# Patient Record
Sex: Female | Born: 1935 | Race: Black or African American | Hispanic: No | Smoking: Never smoker
Health system: Southern US, Community
[De-identification: ages and names within clinical notes are randomized; demographics above are authoritative.]

## PROBLEM LIST (undated history)

## (undated) DIAGNOSIS — M1990 Unspecified osteoarthritis, unspecified site: Secondary | ICD-10-CM

## (undated) HISTORY — DX: Unspecified osteoarthritis, unspecified site: M19.90

## (undated) HISTORY — PX: ABDOMINAL HYSTERECTOMY: SHX81

## (undated) HISTORY — PX: JOINT REPLACEMENT: SHX530

---

## 1998-02-11 ENCOUNTER — Emergency Department (HOSPITAL_COMMUNITY): Admission: EM | Admit: 1998-02-11 | Discharge: 1998-02-11 | Payer: Self-pay | Admitting: Emergency Medicine

## 1998-05-14 ENCOUNTER — Emergency Department (HOSPITAL_COMMUNITY): Admission: EM | Admit: 1998-05-14 | Discharge: 1998-05-14 | Payer: Self-pay | Admitting: Emergency Medicine

## 1999-09-28 ENCOUNTER — Encounter: Payer: Self-pay | Admitting: Internal Medicine

## 1999-09-28 ENCOUNTER — Encounter: Admission: RE | Admit: 1999-09-28 | Discharge: 1999-09-28 | Payer: Self-pay | Admitting: Internal Medicine

## 1999-10-31 ENCOUNTER — Other Ambulatory Visit: Admission: RE | Admit: 1999-10-31 | Discharge: 1999-10-31 | Payer: Self-pay | Admitting: Urology

## 1999-12-11 ENCOUNTER — Other Ambulatory Visit: Admission: RE | Admit: 1999-12-11 | Discharge: 1999-12-11 | Payer: Self-pay | Admitting: Urology

## 2000-10-02 ENCOUNTER — Encounter: Payer: Self-pay | Admitting: Internal Medicine

## 2000-10-02 ENCOUNTER — Encounter: Admission: RE | Admit: 2000-10-02 | Discharge: 2000-10-02 | Payer: Self-pay | Admitting: Internal Medicine

## 2000-11-04 ENCOUNTER — Other Ambulatory Visit: Admission: RE | Admit: 2000-11-04 | Discharge: 2000-11-04 | Payer: Self-pay | Admitting: Urology

## 2001-10-05 ENCOUNTER — Encounter: Payer: Self-pay | Admitting: Internal Medicine

## 2001-10-05 ENCOUNTER — Encounter: Admission: RE | Admit: 2001-10-05 | Discharge: 2001-10-05 | Payer: Self-pay | Admitting: Internal Medicine

## 2002-04-08 ENCOUNTER — Encounter: Payer: Self-pay | Admitting: Orthopedic Surgery

## 2002-04-14 ENCOUNTER — Inpatient Hospital Stay (HOSPITAL_COMMUNITY): Admission: RE | Admit: 2002-04-14 | Discharge: 2002-04-16 | Payer: Self-pay | Admitting: Orthopedic Surgery

## 2002-04-16 ENCOUNTER — Inpatient Hospital Stay (HOSPITAL_COMMUNITY)
Admission: RE | Admit: 2002-04-16 | Discharge: 2002-04-21 | Payer: Self-pay | Admitting: Physical Medicine & Rehabilitation

## 2002-05-04 ENCOUNTER — Encounter: Admission: RE | Admit: 2002-05-04 | Discharge: 2002-05-27 | Payer: Self-pay | Admitting: Orthopedic Surgery

## 2002-10-06 ENCOUNTER — Encounter: Payer: Self-pay | Admitting: Internal Medicine

## 2002-10-06 ENCOUNTER — Encounter: Admission: RE | Admit: 2002-10-06 | Discharge: 2002-10-06 | Payer: Self-pay | Admitting: Internal Medicine

## 2003-04-01 ENCOUNTER — Encounter (INDEPENDENT_AMBULATORY_CARE_PROVIDER_SITE_OTHER): Payer: Self-pay | Admitting: *Deleted

## 2003-04-01 ENCOUNTER — Ambulatory Visit (HOSPITAL_COMMUNITY): Admission: RE | Admit: 2003-04-01 | Discharge: 2003-04-01 | Payer: Self-pay | Admitting: *Deleted

## 2003-10-10 ENCOUNTER — Encounter: Admission: RE | Admit: 2003-10-10 | Discharge: 2003-10-10 | Payer: Self-pay | Admitting: Internal Medicine

## 2004-07-06 ENCOUNTER — Inpatient Hospital Stay (HOSPITAL_COMMUNITY): Admission: RE | Admit: 2004-07-06 | Discharge: 2004-07-09 | Payer: Self-pay | Admitting: Orthopedic Surgery

## 2004-10-22 ENCOUNTER — Encounter: Admission: RE | Admit: 2004-10-22 | Discharge: 2004-10-22 | Payer: Self-pay | Admitting: Internal Medicine

## 2005-10-25 ENCOUNTER — Encounter: Admission: RE | Admit: 2005-10-25 | Discharge: 2005-10-25 | Payer: Self-pay | Admitting: Internal Medicine

## 2006-01-24 ENCOUNTER — Encounter: Admission: RE | Admit: 2006-01-24 | Discharge: 2006-01-24 | Payer: Self-pay | Admitting: Internal Medicine

## 2006-10-27 ENCOUNTER — Encounter: Admission: RE | Admit: 2006-10-27 | Discharge: 2006-10-27 | Payer: Self-pay | Admitting: Internal Medicine

## 2007-10-30 ENCOUNTER — Encounter: Admission: RE | Admit: 2007-10-30 | Discharge: 2007-10-30 | Payer: Self-pay | Admitting: Internal Medicine

## 2008-01-26 ENCOUNTER — Encounter: Admission: RE | Admit: 2008-01-26 | Discharge: 2008-01-26 | Payer: Self-pay | Admitting: Internal Medicine

## 2008-10-31 ENCOUNTER — Encounter: Admission: RE | Admit: 2008-10-31 | Discharge: 2008-10-31 | Payer: Self-pay | Admitting: Internal Medicine

## 2009-11-03 ENCOUNTER — Encounter: Admission: RE | Admit: 2009-11-03 | Discharge: 2009-11-03 | Payer: Self-pay | Admitting: Internal Medicine

## 2010-11-05 ENCOUNTER — Encounter
Admission: RE | Admit: 2010-11-05 | Discharge: 2010-11-05 | Payer: Self-pay | Source: Home / Self Care | Attending: Internal Medicine | Admitting: Internal Medicine

## 2011-02-22 NOTE — H&P (Signed)
NAME:  Ashlee Duarte, Ashlee Duarte NO.:  [id number]   MEDICAL RECORD NO.:  [id number]          PATIENT TYPE:  INP   LOCATION:  NA                           FACILITY:  [facility]   PHYSICIAN:  Dyke Brackett, M.D.    DATE OF BIRTH:  1928/04/06   DATE OF ADMISSION:  07/06/2004  DATE OF DISCHARGE:                                HISTORY & PHYSICAL   CHIEF COMPLAINT:  Left knee weakness and locking for the last four to tive  years.   HISTORY OF PRESENT ILLNESS:  This 77 year old black female patient presented  to Dr. Madelon Lips with a four to five-year history of gradual onset of  progressively worsening intermittent knee pain. She has a history of right  knee replacement by Dr. Madelon Lips July 2003, and she did well following that  surgery. She has had no known injury or prior surgery to her left knee.   The biggest problem she reports with her knee is that it locks at times, and  the leg is weak.  She has intermittent soreness on the anterior aspect of  the knee without radiation.  Difficulty with her knee increases with walking  or after exercises and decreases with Advil and use of swimming pool.  The  knee does grind, pop, and lock at times.  It also swells.  There is no  catching or giving way.  The pain does not keep her up at night.  She does  not require any assistive devices. She has received cortisone injections in  the past with some relief.   ALLERGIES:  SULFA.   CURRENT MEDICATIONS:  1.  Hydrochlorothiazide 25 mg 1/2 tablet p.o. q.a.m.  2.  Lipitor 10 mg 1 tablet p.o. q.a.m.  3.  Multivitamins 1 tablet p.o. q.a.m.  4.  Detrol LA 4 mg 1 tablet p.o. q.a.m. p.r.n.  5.  Vitamin B 1 p.o. q.a.m..  6.  Vitamin C 1 p.o. q.a.m.  7.  Vitamin D 1 p.o. q.a.m.  8.  Fish oil 1 p.o. q.a.m.  9.  Calcium 600 mg 1 tablet p.o. q.a.m.   PAST MEDICAL HISTORY:  1.  Hypertension.  2.  Hypercholesterolemia.  3.  Mild peripheral edema.   She denies any history of diabetes mellitus, thyroid  disease, hiatal hernia,  peptic ulcer disease, reflux heart disease, or any other chronic medical  condition other than previously noted.   PAST SURGICAL HISTORY:  1.  Tonsillectomy as a child.  2.  Hysterectomy in 1968.  3.  Right total knee arthroplasty by Dr. Marcie Mowers, M.D. April 14, 2002.   She denies any complications from the above-mentioned procedures.   FAMILY HISTORY:  Mother died at about age 78 with respiratory complications  and possibly pneumonia.  Father died in his 33s of unknown causes.  She has  five brothers and three sisters who have all died due to multiple cardiac  issues.  She has one sister who is currently alive, 102 years old, with  history of anxiety disorder.   PRIMARY CARE PHYSICIAN:  Antony Madura, M.D.  REVIEW OF SYSTEMS:  She does have upper and lower partial plate dentures.  She has a small area on the left upper jaw line that has been irritated for  the last couple of days, and she has been treating it with warm salt water  rinses.  She will see a dentist about that.  She has some mild neck  stiffness.  She will donate 1 unit of autologous blood.  She does have a  living will but no power of attorney.  All other systems are negative and  noncontributory.   PHYSICAL EXAMINATION:  GENERAL:  Well-developed, well-nourished, overweight  black female in no acute distress.  Walks with a limp.  Mood and affect are  appropriate.  Talks easily with examiner.  VITAL SIGNS: Height 5 feet 7 inches, weight 204 pounds.  BMI is 31.  Temperature 96.3 degrees F, pulse 72, respirations 20, blood pressure  144/74.  HEENT:  Normocephalic and atraumatic without frontal or maxillary sinus  tenderness to palpation.  Conjunctivae pink, sclerae anicteric.  PERRLA.  EOMs intact.  No visible external ear deformities.  Moderate amount of  cerumen in both canals.  Tympanic membranes pearly grey with good light  reflex.  Nose and nasal septum midline.  Nasal  mucosa pink and moist without  exudate or polyps noted.  Buccal mucosa pink and moist.  Partial plate  dentures are in place.  She does have an area of redness on upper jaw line  in the front but on the left side.  It is minimal, no active drainage from  that area.  Pharynx without erythema or exudate.  Tongue and uvula midline.  Tongue without fasciculations, and uvula rises equally with phonation.  NECK:  No visible masses or lesions noted.  Trachea midline.  No palpable  lymphadenopathy nor thyromegaly.  Carotids +1 bilaterally without bruits.  Full range of motion and nontender to palpation along the cervical spine.  CARDIOVASCULAR:  Heart rate and rhythm regular.  S1, S2 present without  rubs, clicks, or murmurs noted.  RESPIRATORY:  Respirations even and unlabored.  Breath sounds clear to  auscultation bilaterally without rales or wheezes noted.  ABDOMEN:  Rounded abdominal contour. Bowel sounds presents x 4 quadrants.  Soft, nontender to palpation without hepatosplenomegaly or CVA tenderness.  EXTREMITIES:  Femoral pulses +2 bilaterally.  BACK:  Nontender to palpation along the entire length of the vertebral  column.  BREASTS/GU/RECTAL/PELVIC:  These exams deferred at this time.  MUSCULOSKELETAL:  No obvious deformities bilateral upper extremities with  full range of motion of these extremities without pain.  Radial pulses +2.  He has full range of motion of his hips, ankles, and toes bilaterally.  DP  and PT pulses +2.  Mild lower extremity edema that is nonpitting  bilaterally.  No calf pain with palpation and negative Homan's sign  bilaterally.  RIGHT KNEE:  Has a well-healed midline incision.  Skin otherwise intact.  She has full  extension and flexion to 110 degrees without crepitance.  There is pain with palpation long the joint line and no effusion.  She is  stable to varus and valgus stress.  Negative anterior drawer. LEFT KNEE:  Skin is intact without erythema or  ecchymosis.  She has full  extension and flexion to 100 degrees with a moderate amount of crepitance.  She is acutely tender to palpation along the medial joint line and some on  the lateral joint line.  She does have a +1 effusion.  She  is stable to  varus and valgus stress.  Negative anterior drawer.  NEUROLOGIC:  Alert and oriented x 3.  Cranial nerves II-XII grossly intact.  Strength 5/5 bilateral upper and lower extremities.  Rapid alternating  movements intact.  Deep tendon reflexes 2+ bilateral upper and lower  extremities.  Sensation intact to light touch.   IMPRESSION:  1.  End-stage osteoarthritis bilateral knees, status post right total knee      arthroplasty.  2.  Hypertension.  3.  Obesity.  4.  Hypercholesterolemia.  5.  Mild peripheral edema.   PLAN:  Ms. Rekowski will be admitted to [facility] on July 06, 2004 , where the patient will undergo a left total knee arthroplasty by Dr.  Marcie Mowers.  She will undergo all the routine preoperative laboratory  tests and studies prior to this procedure.  If we have any medical issues  while hospitalized, we will consult on of the hospitalists.       KED/MEDQ  D:  06/28/2004  T:  06/28/2004  Job:  [id number]

## 2011-02-22 NOTE — H&P (Signed)
. [facility]  Patient:    Ashlee Duarte, Ashlee Duarte Visit Number: [id number] MRN: [id number]          Service Type: Attending:  Sharlot Gowda., M.D. Dictated by:   Jamelle Rushing, P.A. Adm. Date:  04/14/02                           History and Physical  DATE OF BIRTH: 02/10/1928  CHIEF COMPLAINT: Right knee pain.  HISTORY OF PRESENT ILLNESS: The patient is a 77 year old black female, with knee problems for about the last eight years.  The patients problems have progressively worsened with time and the amount of activity.  She does have significant difficulty getting in and out of a chair, her bed, a car.  She does have stiffness and locking of the knee with any long-term standing or sitting.  She describes the pain as primarily being a dull aching sensation around the joint line and very proximal tibia region.  She does have a bone-on-bone grating sensation with occasional popping.  She does have significant swelling, and she does have pain at night.  ALLERGIES: SULFA.  CURRENT MEDICATIONS:  1. Bextra 20 mg p.o. q.d.  2. Hydrochlorothiazide 12.5 mg p.o. q.d.  3. Tylenol p.r.n.  4. Multivitamin.  PAST MEDICAL HISTORY:  1. Hypertension.  2. Peripheral edema.  PAST SURGICAL HISTORY: Hysterectomy in 1968, with no complications.  SOCIAL HISTORY: The patient is a 77 year old obese black female.  Denies any history of smoking or alcohol use.  She is a widow.  She does have one grown child.  She lives in a two-story home.  She is currently retired.  FAMILY PHYSICIAN: Dr. Burton Apley.  FAMILY MEDICAL HISTORY: Mother is deceased from respiratory complications, possibly pneumonia.  Father is deceased from unknown causes.  The patient has five brothers and three sisters deceased from multiple cardiac issues.  One sister alive, with history of anxiety and nervous disorder.  REVIEW OF SYSTEMS: Positive for upper and lower partial plates.   She does occasionally get short of breath with exertion related to obesity, deconditioning, and knee pain.  Otherwise the Review Of Systems is negative.  PHYSICAL EXAMINATION:  VITAL SIGNS: Height is 5 feet 7 inches.  Weight is 205 pounds.  Pulse 88, respirations 12, temperature 99.0 degrees, blood pressure 164/90.  GENERAL: This is a healthy-appearing, well-developed, obese black female.  She ambulates very slowly with a significant swaying gait.  She has a fair amount of difficulty getting on and off the examination table or in and out of a chair without assistance.  HEENT: Head normocephalic, atraumatic.  Nontender over maxillary or frontal sinuses.  PERRLA.  EOMI.  TMs pearly gray and intact.  Gross hearing is intact.  Nasal septum midline.  Mucous membranes pink and moist.  No polyps. Oral buccal mucosa pink and moist without lesions.  Upper and lower partial plates in place, rest of dentition in fair repair.  The patient is able to swallow without any difficulty.  LUNGS: Clear and equal bilaterally.  No wheezes, rales, rhonchi, or rubs noted.  HEART: Regular rate and rhythm.  S1 and S2 auscultated.  No murmurs, rubs, or gallops.  ABDOMEN: Round, obese.  No hepatosplenomegaly palpable due to obesity.  Soft, nontender to deep palpation.  Bowel sounds were normoactive throughout.  CVA nontender to percussion.  EXTREMITIES: Symmetric size and shape.  The patient had a significant amount of proximal extremity  obesity.  She had good range of motion of her shoulders, elbows, and wrists without any difficulty.  Motor strength was 5/5.  Lower extremities symmetric size and shape.  Motor surgical was 5/5 in all muscle groups tested.  She once again had a significant amount of proximal extremity obesity.  Right and left hip had good range of motion without any deficits or mechanical symptoms.  Right knee had no signs of erythema or ecchymosis.  There was a fair amount of soft tissue  swelling around the joint. No palpable effusion.  She was tender along the lateral joint line.  She had about a 12 degree valgus deformity.  She had about 10 degrees of laxity with valgus-varus stressing.  No anterior or posterior drawer.  The calf was nontender.  Range of motion was from 0-95 degrees, limited by soft tissue in her thigh.  The left knee was without any signs of erythema or ecchymosis. She had no palpable effusion.  She was slightly tender along the medial and lateral joint line.  She had about a 5 degree valgus-varus laxity.  She had full extension and 95 degrees of flexion, limited by soft tissue in her thigh. Bilateral calves were nontender.  Bilateral ankles were symmetric with good dorsiflexion and plantar flexion.  Peripheral vasculature - carotid pulses were 2+, radial pulses were 2+, dorsalis pedis and posterior tibial pulses were 2+.  The patient had 1+ pitting edema.  No significant varicosities or lower extremity pigmentation changes.  NEUROLOGIC: The patient was conscious, alert, appropriate, and held an easy conversation with the examiner.  Cranial nerves II-XII were grossly intact. Deep tendon reflexes of the upper and lower extremities were symmetric right-to-left.  BREAST/RECTAL/GU: Examinations were deferred at this time.  IMPRESSION:  1. Hypertension.  2. Lower extremity peripheral edema.  3. Obesity.  4. End-stage osteoarthritis, right greater than left.  PLAN: The patient will be admitted to [facility] on April 14, 2002 under the care of Dr. Frederico Hamman, M.D.  The patient will undergo all routine laboratories and tests prior to undergoing a right total knee arthroplasty.  The patient does have 2 units of autologous blood donated. Dictated by:   Jamelle Rushing, P.A. Attending:  Sharlot Gowda., M.D. DD:  04/01/02 TD:  04/03/02 Job: [id number]

## 2011-02-22 NOTE — Op Note (Signed)
NAME:  Ashlee Duarte, Ashlee Duarte NO.:  [id number]   MEDICAL RECORD NO.:  [id number]          PATIENT TYPE:  INP   LOCATION:  5035                         FACILITY:  [facility]   PHYSICIAN:  Thera Flake., M.D.DATE OF BIRTH:  09/01/1928   DATE OF PROCEDURE:  07/06/2004  DATE OF DISCHARGE:                                 OPERATIVE REPORT   PREOPERATIVE DIAGNOSIS:  Osteoarthritis, left knee, with severe varus  deformity.   POSTOPERATIVE DIAGNOSIS:  Osteoarthritis, left knee, with severe varus  deformity.   OPERATION:  Left total knee replacement (LCS standard femur, standard  patella, 2.5-size tibial tray with 12.5-mm bearing with 3-prong patella).   SURGEON:  Dyke Brackett, M.D.   ASSISTANT:  Madilyn Fireman, P.A.-C.   TOURNIQUET TIME:  One hour and 30 minutes.   DESCRIPTION OF PROCEDURE:  Sterile prep and drape, supine position,  exsanguination of the leg and placement of tourniquet to 375.  Straight skin  incision, medial parapatellar approach to the knee, was made.  Severe varus  deformity of the knee was noted with bony overgrowth along the medial side.  Tibia was cut with a 7-degree posterior slope, followed by an anterior and  posterior femoral cut for a 12.5-mm flexion gap, later equalized with the  extension gap to be the same.  Distal 4-degree cut was then made with a  provisional second cut but to allow for extension with excellent balance of  the ligaments, both with a spacer block with flexion and extension.  Excess  portions of the menisci were removed.  The finishing guide was used with a  chamfer cut as well as the keel hole for the femoral prosthesis, followed by  delivery of the tibia anteriorly, release of the PCL.  A keel hole was cut  for the tibial prosthesis, followed by a 2.5-size tibial tray and trial  reduction.  A 3-peg patella was used, leaving 14 mm of native patella.  Best  component thickness was trialed at a 10, then 12.5 and deemed to be more  acceptable at 12.5 with excellent stability, full range of motion.  Trial  components were removed and final components were inserted after bony  surfaces were irrigated with 2 batches of cement in the doughy state.  Excess cement was removed.  A trial bearing was again placed; it was  removed, tourniquet was released, small bleeders were coagulated, excess  cement was removed from the back of the knee.  Good hemostasis was obtained.  Closure was effected with #1 Ethibond, 2-0 Vicryl and skin clips, Marcaine  with epinephrine on the skin, applied the compressive sterile dressing, knee  immobilizer applied.      [name]   WDC/MEDQ  D:  07/06/2004  T:  07/07/2004  Job:  [id number]

## 2011-02-22 NOTE — Discharge Summary (Signed)
Ashlee Duarte. [facility]  Patient:    Ashlee, Duarte Visit Number: [id number] MRN: [id number]          Service Type: [facility] Location: [id number] Attending Physician:  Faith Rogue T Dictated by:   Mcarthur Rossetti. Angiulli, P.A. Admit Date:  04/16/2002 Discharge Date: 04/21/2002   CC:         Faith Rogue, M.D.  Sharlot Gowda., M.D.  Antony Madura, M.D.   Discharge Summary  DISCHARGE DIAGNOSES: 1. Right total knee replacement secondary to osteoarthritis, bone on bone,    April 14, 2002. 2. Anemia. 3. Hypertension. 4. Peripheral edema.  HISTORY OF PRESENT ILLNESS:  This is a 77 year old black female admitted July 9 with end-stage bone on bone osteoarthritis of her right knee and no relief with conservative care.  She underwent a right total knee replacement on July 9 per Dr. Madelon Lips.  She was placed on subcutaneous Lovenox for deep vein thrombosis prophylaxis and weight-bearing as tolerated.  Hospital course was uneventful.  No chest pain, no nausea or vomiting.  Supervision for ambulation, moderate assistance for transfers.  Latest hemoglobin was 8.4, chemistries unremarkable.  Admitted for a comprehensive rehab program.  PAST MEDICAL HISTORY:  See discharge diagnoses.  PAST SURGICAL HISTORY: 1. Hysterectomy. 2. Right carpal tunnel release.  HABITS:  No alcohol or tobacco.  ALLERGIES:  SULFA.  PRIMARY PHYSICIAN:  Antony Madura, M.D.  MEDICATIONS PRIOR TO ADMISSION: 1. Bextra. 2. Hydrochlorothiazide. 3. Multivitamin.  SOCIAL HISTORY:  Lives alone in [place].  Independent prior to admission, retired.  One-level apartment.  Good supportive friends.  HOSPITAL COURSE:  The patient did well while in rehabilitation services with therapies initiated on a b.i.d. basis.  The following issues were followed during the patients rehab course.  Pertaining to Ms. Robinsons right total knee replacement, she remained stable.  Surgical  site healing nicely, no signs of infection.  She was ambulating with supervision with a walker, weight-bearing as tolerated with total knee precautions.  She was continued on subcutaneous Lovenox for deep vein thrombosis prophylaxis.  A home health nurse would be provided as Lovenox would continue per protocol.  She was also fully educated on self-administration of injections.  Postoperative anemia was stable.  Latest hemoglobin was 9.3 and hematocrit 28.1.  There were no bleeding episodes.  Blood pressure was controlled with hydrochlorothiazide. She had no bowel or bladder disturbances.  She was ambulating extended distances with a walker of greater than 150 feet, independent with activities of daily living.  The plan was for home health therapies to be arranged.  LATEST LABORATORY:  Sodium 144, potassium 3.6, BUN 10, creatinine 0.6, hemoglobin 9.3, hematocrit 28.1  DISCHARGE MEDICATIONS: 1. Subcutaneous Lovenox 40 mg daily x 3-5 more days and discontinue. 2. Trinsicon b.i.d. 3. Hydrochlorothiazide 12.5 mg daily. 4. Multivitamin daily. 5. Bextra 20 mg daily.  DISCHARGE INSTRUCTIONS: 1. Activity: Weight-bearing as tolerated with walker. 2. Diet: Regular. 3. Wound care: Follow up with Dr. Madelon Lips in one week.  Home health therapies    had been arranged, as well as home health nursing for monitoring of    Lovenox injections.  She would follow up with Dr. Zenaida Deed for medical    management. Dictated by:   Mcarthur Rossetti. Angiulli, P.A. Attending Physician:  Faith Rogue T DD:  04/20/02 TD:  04/22/02 Job: [id number]

## 2011-02-22 NOTE — Discharge Summary (Signed)
NAME:  Ashlee Duarte, Ashlee Duarte NO.:  [id number]   MEDICAL RECORD NO.:  [id number]          PATIENT TYPE:  INP   LOCATION:  5035                         FACILITY:  [facility]   PHYSICIAN:  Dyke Brackett, M.D.    DATE OF BIRTH:  16-Oct-1927   DATE OF ADMISSION:  07/06/2004  DATE OF DISCHARGE:  07/09/2004                                 DISCHARGE SUMMARY   ADMISSION DIAGNOSES:  1.  End-stage osteoarthritis bilateral knees, status post right knee      replacement.  2.  Hypertension.  3.  Obesity.  4.  Mild lower extremity peripheral edema.   DISCHARGE DIAGNOSES:  1.  End-stage osteoarthritis bilateral knees, status post right knee      replacement, now status post left knee replacement.  2.  Acute blood loss anemia secondary to surgery.  3.  Small leukocytosis.  4.  Constipation.  5.  Hypertension.  6.  Hypercholesterolemia.  7.  Obesity.  8.  Lower extremity peripheral edema.   PROCEDURE:  On July 06, 2004, Ashlee Duarte underwent a left total knee  arthroplasty by Dr. Dyke Brackett, assisted by Richardean Canal, P.A.C.  Ashlee  had an LCS complete metal back patella cemented size standard with a DePuy  MVT keel tibial tray cemented size 2.5 with an LCS complete primary femoral  component cemented size standard left and an LCS complete RP insert size  standard 12.5 mm thickness.   COMPLICATIONS:  None.   CONSULTATIONS:  1.  Pharmacy consult for Coumadin therapy July 06, 2004.  2.  Physical therapy and occupational therapy consults July 07, 2004.   HISTORY OF PRESENT ILLNESS:  This 77 year old black female patient presented  to Dr. Madelon Lips with a history of bilateral knee pain.  Ashlee had had problems  with her right knee and subsequently had that one replaced.  Ashlee did well  with that but continued to have left knee pain that has been getting worse.  X-rays show end-stage osteoarthritis of the knee and Ashlee has failed  conservative treatment and because of that,  Ashlee is presenting for a left  knee replacement.   HOSPITAL COURSE:  Ashlee Duarte tolerated her surgical procedure well without  immediately postoperative complications.  Ashlee was subsequently transferred  to 5000.  On postoperative day #1, Ashlee was afebrile, vital signs stable.  Few rhonchi noted in her lungs which was addressed with pulmonary toilet.  Ashlee had some mild hypokalemia which was treated with supplements.  Hemoglobin was monitored at 9.9 with hematocrit of 28.6.  Ashlee was started on  therapy per protocol.   Ashlee continued to make good progress over the next several days.  Ashlee  remained basically afebrile.  Ashlee was able to be switched to p.o.  medications effectively to control her pain.  Ashlee progressed well with  therapy.  Potassium improved to 3.5 on July 09, 2004.  On July 09, 2004, Ashlee is felt to be ready for discharge home and will be discharged home  later today.   DISCHARGE INSTRUCTIONS:  1.  Diet:  Ashlee can resume her regular prehospitalization diet.  2.  Medications:  Ashlee may resume her prehospitalization medications except      for no Advil or Aleve while on Coumadin.  Home medications include:      1.  Lipitor 10 mg p.o. q.h.s.      2.  Hydrochlorothiazide 12.5 mg p.o. q.a.m.      3.  Detrol LA 4 mg p.o. daily p.r.n.      4.  Multivitamin one tablet p.o. daily.      5.  Vitamin B one tablet p.o. daily.      6.  Vitamin C one tablet p.o.  daily.      7.  Fish oil capsule one tablet p.o. daily.  3.  Additional medications:      1.  Coumadin, take as directed by the pharmacy at 6 p.m. for one month.      2.  Percocet 5/325 mg one to two p.o. q.4h. p.r.n. for pain, 50 with no          refill.      3.  Robaxin 500 mg one to two tablets p.o. q.6h. p.r.n. for spasms, 40          with no refill.      4.  Trinsicon one tablet p.o. b.i.d. for one month.  4.  Activity:  Ashlee can be out of bed weightbearing as tolerated on the left      leg with use of the walker. Ashlee  is to have home CPM 0 to 100 degrees six      to eight hours a day.  Home health PT per [facility].      Please see the blue total knee discharge sheet for further activity      instructions.  5.  Wound care:  Ashlee may shower after no drainage from the wound for two      days.  Please see the blue total knee discharge sheet for further wound      care instructions.  6.  Follow-up:  Home health RN is to draw a CBC with differential on July 11, 2004, and fax those results to our office.  The patient needs to      follow up with Dr. Madelon Lips on Thursday, July 19, 2004, and call [phone] for that appointment.   LABORATORY DATA:  Chest x-ray done on July 02, 2004, showed no active  cardiopulmonary disease.   On July 02, 2004, hemoglobin 11.7, hematocrit 34.4.  On July 07, 2004, hemoglobin 9.9, hematocrit 28.6, white count 11.2.  On July 08, 2004, white count 13.7, hemoglobin 9.5, hematocrit 27.6.  On July 09, 2004, white count 12.8, hemoglobin 9.4, hematocrit 27.1, platelets 282, MCV  89.   On July 02, 2004, potassium 4.1.  On July 07, 2004, potassium was  2.9.  On July 08, 2004, it was 3.3 and on July 09, 2004, it was 3.5.  Glucose ranged from 115 on July 02, 2004, to 125 on July 07, 2004.   PT on admission on July 02, 2004, was 13.4, INR 1, PTT 28.  On July 09, 2004, PT is 13.9, INR 1.1.   Calcium on July 09, 2004, is 8.3.  UA on July 06, 2004, showed rare  bacteria, small leukocyte esterase,  0 to 2 white cells, 3 to 6 red cells,  moderate hemoglobin.  All other indices within normal limits.  Urine culture  is pending at this time.      Legrand Pitts   KED/MEDQ  D:  07/09/2004  T:  07/09/2004  Job:  [id number]   cc:   Antony Madura, M.D.  [address]  [place]  [place] [address]  Fax: [phone]

## 2011-02-22 NOTE — Op Note (Signed)
[place]. [facility]  Patient:    Ashlee Duarte, Ashlee Duarte Visit Number: [id number] MRN: [id number]          Service Type: [facility] Location: [id number] Attending Physician:  Faith Rogue T Dictated by:   Sharlot Gowda., M.D. Proc. Date: 04/14/02 Admit Date:  04/16/2002                             Operative Report  PREOPERATIVE DIAGNOSIS:  Osteoarthritis, right knee.  POSTOPERATIVE DIAGNOSIS:  Osteoarthritis, right knee.  PROCEDURE:  Right total knee replacement (LCS rotating platform, standard femur and patella, 2.5 size tibia with 12.5 mm bearing).  SURGEON:  Sharlot Gowda., M.D.  ASSISTANT:  Jamelle Rushing, P.A.  TOURNIQUET TIME:  Approximately 1 hour 10 minutes.  DESCRIPTION OF PROCEDURE:  Sterile prep and drape.  Exsanguination of the leg. Placement of the tourniquet to 400 mmHg.  Straight skin incision, medial parapatellar approach to the knee was made.  There was relatively severe compartmental disease, greater on the medial side.  The tibia was cut with a 7 degree posterior slope about 2 degrees below the most diseased compartment, followed by cutting the anterior-posterior femoral area in a 4 degree valgus cut on the distal femur.  The flexion-extension gap was equalized to be at 12.5 mm.  Chamfer cuts were made, followed by a keel hole for the tibia.  The patella was cut, leaving about 13 mm of native bone on the patella, replacing about 10 mm of thickness on the patella for a three-pronged rotating platform patella.  Trials were inserted, excellent stability was noted with full extension to slight recurvatum, excellent stability of the bearing, and flexion up to about 110 degrees without any instability noted.  Trials were removed, bony surfaces were irrigated.  Bone cement impregnated with Zinacef was inserted, two packs of cement each with 750 mg of Zinacef in the antibiotic, tibia, followed by femur, patella.  Excess cement  was removed. The cement was allowed to harden.  The tourniquet was released, and small bleeders were coagulated and a Hemovac drain was placed.  The capsular incision was closed with #1 nonabsorbable sutures, 2-0 Vicryl, skin clips. Marcaine with epinephrine infiltrated in the wound, a lightly compressive sterile dressing and knee immobilizer applied. Dictated by:   Sharlot Gowda., M.D. Attending Physician:  Faith Rogue T DD:  04/14/02 TD:  04/17/02 Job: [id number]

## 2011-10-14 ENCOUNTER — Other Ambulatory Visit: Payer: Self-pay | Admitting: Internal Medicine

## 2011-10-14 DIAGNOSIS — Z1231 Encounter for screening mammogram for malignant neoplasm of breast: Secondary | ICD-10-CM

## 2011-11-08 ENCOUNTER — Ambulatory Visit
Admission: RE | Admit: 2011-11-08 | Discharge: 2011-11-08 | Disposition: A | Discharge status: Home / Self Care | Payer: Federal, State, Local not specified - PPO | Source: Ambulatory Visit | Attending: Internal Medicine | Admitting: Internal Medicine

## 2011-11-08 DIAGNOSIS — Z1231 Encounter for screening mammogram for malignant neoplasm of breast: Secondary | ICD-10-CM

## 2012-10-19 ENCOUNTER — Other Ambulatory Visit: Payer: Self-pay | Admitting: Internal Medicine

## 2012-10-19 DIAGNOSIS — Z1231 Encounter for screening mammogram for malignant neoplasm of breast: Secondary | ICD-10-CM

## 2012-11-18 ENCOUNTER — Ambulatory Visit: Payer: Federal, State, Local not specified - PPO

## 2012-12-14 ENCOUNTER — Ambulatory Visit
Admission: RE | Admit: 2012-12-14 | Discharge: 2012-12-14 | Disposition: A | Discharge status: Home / Self Care | Payer: Federal, State, Local not specified - PPO | Source: Ambulatory Visit | Attending: Internal Medicine | Admitting: Internal Medicine

## 2012-12-14 DIAGNOSIS — Z1231 Encounter for screening mammogram for malignant neoplasm of breast: Secondary | ICD-10-CM

## 2013-11-16 ENCOUNTER — Other Ambulatory Visit: Payer: Self-pay

## 2013-11-16 DIAGNOSIS — Z1231 Encounter for screening mammogram for malignant neoplasm of breast: Secondary | ICD-10-CM

## 2013-12-15 ENCOUNTER — Ambulatory Visit
Admission: RE | Admit: 2013-12-15 | Discharge: 2013-12-15 | Disposition: A | Discharge status: Home / Self Care | Payer: Medicare PPO | Source: Ambulatory Visit

## 2013-12-15 DIAGNOSIS — Z1231 Encounter for screening mammogram for malignant neoplasm of breast: Secondary | ICD-10-CM

## 2014-02-07 ENCOUNTER — Other Ambulatory Visit: Payer: Self-pay | Admitting: Internal Medicine

## 2014-02-07 DIAGNOSIS — M810 Age-related osteoporosis without current pathological fracture: Secondary | ICD-10-CM

## 2014-02-07 DIAGNOSIS — Z780 Asymptomatic menopausal state: Secondary | ICD-10-CM

## 2014-02-15 ENCOUNTER — Ambulatory Visit
Admission: RE | Admit: 2014-02-15 | Discharge: 2014-02-15 | Disposition: A | Discharge status: Home / Self Care | Payer: Medicare PPO | Source: Ambulatory Visit | Attending: Internal Medicine | Admitting: Internal Medicine

## 2014-02-15 DIAGNOSIS — Z780 Asymptomatic menopausal state: Secondary | ICD-10-CM

## 2014-02-15 DIAGNOSIS — M810 Age-related osteoporosis without current pathological fracture: Secondary | ICD-10-CM

## 2014-05-12 ENCOUNTER — Ambulatory Visit (INDEPENDENT_AMBULATORY_CARE_PROVIDER_SITE_OTHER): Payer: Medicare PPO | Admitting: Family Medicine

## 2014-05-12 VITALS — BP 128/76 | HR 100 | Temp 98.0°F | Resp 18 | Ht 64.74 in | Wt 220.6 lb

## 2014-05-12 DIAGNOSIS — S61409A Unspecified open wound of unspecified hand, initial encounter: Secondary | ICD-10-CM

## 2014-05-12 DIAGNOSIS — S61401A Unspecified open wound of right hand, initial encounter: Secondary | ICD-10-CM

## 2014-05-12 NOTE — Progress Notes (Addendum)
Subjective:    Patient ID: Ashlee Duarte, female    DOB: 01-21-1928, 80 y.o.   MRN: [id number] This chart was scribed for Ashlee Staggers, MD by Evon Slack, ED Scribe. This Patient was seen in room 07 and the patients care was started at 9:11 PM  Authored by Silas Sacramento, MD - unable to change in [facility].   Chief Complaint  Patient presents with   Laceration    Right pinky finger, cut on alluminum foil box earlier today   Hand Pain    Laceration   Hand Pain    HPI Comments: Ashlee Duarte is a 80 y.o. female who presents to the [facility] complaining of right hand laceration to her 5th digit onset 3PM today. She states she was tearing some aluminium foil and cut her finger. She states she has applied pressure and a guaze that helped control the bleeding. She states she was concerned because she takes blood thinners. She doesn't report any associated numbness. Pt is unsure of her last tetanus shot but will follow up with her PCP.    There are no active problems to display for this patient.  Past Medical History  Diagnosis Date   Arthritis    Past Surgical History  Procedure Laterality Date   Joint replacement     Abdominal hysterectomy     Allergies  Allergen Reactions   Naproxen Sodium Swelling   Prior to Admission medications   Medication Sig Start Date End Date Taking? Authorizing Provider  alendronate (FOSAMAX) 10 MG tablet Take 10 mg by mouth daily before breakfast. Take with a full glass of water on an empty stomach.   Yes Historical Provider, MD  aspirin 81 MG tablet Take 81 mg by mouth daily.   Yes Historical Provider, MD  atorvastatin (LIPITOR) 10 MG tablet Take 10 mg by mouth daily.   Yes Historical Provider, MD  hydrochlorothiazide (HYDRODIURIL) 25 MG tablet Take 25 mg by mouth daily.   Yes Historical Provider, MD  losartan (COZAAR) 25 MG tablet Take 25 mg by mouth daily.   Yes Historical Provider, MD   History   Social  History   Marital Status: Widowed    Spouse Name: N/A    Number of Children: N/A   Years of Education: N/A   Occupational History   Not on file.   Social History Main Topics   Smoking status: Never Smoker    Smokeless tobacco: Not on file   Alcohol Use: No   Drug Use: No   Sexual Activity: Not on file   Other Topics Concern   Not on file   Social History Narrative   No narrative on file   Review of Systems  Skin: Positive for wound.    Objective:   Filed Vitals:   05/12/14 2101  BP: 128/76  Pulse: 100  Temp: 98 F (36.7 C)  TempSrc: Oral  Resp: 18  Height: 5' 4.74" (1.644 m)  Weight: 220 lb 9.6 oz (100.064 kg)  SpO2: 95%     Physical Exam  Nursing note and vitals reviewed. Constitutional: She is oriented to person, place, and time. She appears well-developed and well-nourished. No distress.  HENT:  Head: Normocephalic and atraumatic.  Eyes: Conjunctivae and EOM are normal.  Neck: Neck supple. No tracheal deviation present.  Cardiovascular: Normal rate.   Pulmonary/Chest: Effort normal. No respiratory distress.  Musculoskeletal: Normal range of motion.  Neurological: She is alert and oriented to person, place, and  time.  Skin: Skin is warm and dry. Laceration noted.  approximate 2mm laceration on ulnar aspect of the lateral nail fold of 5th finger on right hand, hemostatic superficial at proximal aspect with slight deeper component distally.  Psychiatric: She has a normal mood and affect. Her behavior is normal.   With iris scissors, removed small attached piece of skin, cleansed with soap and water, then xeroform bandage applied.   Assessment & Plan:   Thereasa ParkinGloria L Schumacher is a 80 y.o. female Wound, open, hand with or without fingers, right, initial encounter Small lac.avulsion, but no area to suture.  UTD on tetanus to her recollection. She can verify this with her PCP office.  Xeroform for 48 hrs, then wound care as below. rtc precautions  discussed. Understanding expressed.   Meds ordered this encounter  Medications   hydrochlorothiazide (HYDRODIURIL) 25 MG tablet    Sig: Take 25 mg by mouth daily.   losartan (COZAAR) 25 MG tablet    Sig: Take 25 mg by mouth daily.   aspirin 81 MG tablet    Sig: Take 81 mg by mouth daily.   alendronate (FOSAMAX) 10 MG tablet    Sig: Take 10 mg by mouth daily before breakfast. Take with a full glass of water on an empty stomach.   atorvastatin (LIPITOR) 10 MG tablet    Sig: Take 10 mg by mouth daily.   methylPREDNISolone (MEDROL DOSEPAK) 4 MG tablet    Sig: Take by mouth. follow package directions   Patient Instructions  Keep xeroform bandage on this area for next 2 days, then starting Saturday - can clean area with soap and water and cover with bandage until area healed. If starts to bleed- apply pressure as discussed below.   WOUND CARE For 48 hours, do not remove bandage  After 48 hours, remove bandage and wash wound gently with mild soap and warm water. Reapply a new bandage after cleaning wound, if directed.  Continue daily cleansing with soap and water until stitches/staples are removed.  Notify the office if you experience any of the following signs of infection: Swelling, redness, pus drainage, streaking, fever >101.0 F  Notify the office if you experience excessive bleeding that does not stop after 15-20 minutes of constant, firm pressure.  Return to the clinic or go to the nearest emergency room if any of your symptoms worsen or new symptoms occur.     I personally performed the services described in this documentation, which was scribed in my presence. The recorded information has been reviewed and considered, and addended by me as needed.

## 2014-05-12 NOTE — Patient Instructions (Addendum)
Keep xeroform bandage on this area for next 2 days, then starting Saturday - can clean area with soap and water and cover with bandage until area healed. If starts to bleed- apply pressure as discussed below.   WOUND CARE .For 48 hours, do not remove bandage . After 48 hours, remove bandage and wash wound gently with mild soap and warm water. Reapply a new bandage after cleaning wound, if directed. . Continue daily cleansing with soap and water until stitches/staples are removed. . Notify the office if you experience any of the following signs of infection: Swelling, redness, pus drainage, streaking, fever >101.0 F . Notify the office if you experience excessive bleeding that does not stop after 15-20 minutes of constant, firm pressure.  Return to the clinic or go to the nearest emergency room if any of your symptoms worsen or new symptoms occur.

## 2014-11-17 ENCOUNTER — Other Ambulatory Visit: Payer: Self-pay

## 2014-11-17 DIAGNOSIS — Z1231 Encounter for screening mammogram for malignant neoplasm of breast: Secondary | ICD-10-CM

## 2015-01-02 ENCOUNTER — Ambulatory Visit
Admission: RE | Admit: 2015-01-02 | Discharge: 2015-01-02 | Disposition: A | Discharge status: Home / Self Care | Payer: Medicare PPO | Source: Ambulatory Visit

## 2015-01-02 ENCOUNTER — Other Ambulatory Visit: Payer: Self-pay

## 2015-01-02 DIAGNOSIS — Z1231 Encounter for screening mammogram for malignant neoplasm of breast: Secondary | ICD-10-CM

## 2015-12-08 ENCOUNTER — Other Ambulatory Visit: Payer: Self-pay

## 2015-12-08 DIAGNOSIS — Z1231 Encounter for screening mammogram for malignant neoplasm of breast: Secondary | ICD-10-CM

## 2016-01-05 ENCOUNTER — Ambulatory Visit: Payer: Medicare PPO

## 2016-01-11 ENCOUNTER — Ambulatory Visit: Payer: Medicare PPO

## 2016-01-23 ENCOUNTER — Ambulatory Visit
Admission: RE | Admit: 2016-01-23 | Discharge: 2016-01-23 | Disposition: A | Discharge status: Home / Self Care | Payer: Medicare Other | Source: Ambulatory Visit

## 2016-01-23 DIAGNOSIS — Z1231 Encounter for screening mammogram for malignant neoplasm of breast: Secondary | ICD-10-CM

## 2017-02-26 ENCOUNTER — Other Ambulatory Visit: Payer: Self-pay | Admitting: Internal Medicine

## 2017-02-26 DIAGNOSIS — Z1231 Encounter for screening mammogram for malignant neoplasm of breast: Secondary | ICD-10-CM

## 2017-03-21 ENCOUNTER — Ambulatory Visit
Admission: RE | Admit: 2017-03-21 | Discharge: 2017-03-21 | Disposition: A | Discharge status: Home / Self Care | Payer: Medicare Other | Source: Ambulatory Visit | Attending: Internal Medicine | Admitting: Internal Medicine

## 2017-03-21 DIAGNOSIS — Z1231 Encounter for screening mammogram for malignant neoplasm of breast: Secondary | ICD-10-CM

## 2018-02-26 ENCOUNTER — Other Ambulatory Visit: Payer: Self-pay | Admitting: Internal Medicine

## 2018-02-26 DIAGNOSIS — Z1231 Encounter for screening mammogram for malignant neoplasm of breast: Secondary | ICD-10-CM

## 2018-03-25 ENCOUNTER — Ambulatory Visit
Admission: RE | Admit: 2018-03-25 | Discharge: 2018-03-25 | Disposition: A | Discharge status: Home / Self Care | Payer: Medicare Other | Source: Ambulatory Visit | Attending: Internal Medicine | Admitting: Internal Medicine

## 2018-03-25 DIAGNOSIS — Z1231 Encounter for screening mammogram for malignant neoplasm of breast: Secondary | ICD-10-CM

## 2018-06-09 ENCOUNTER — Other Ambulatory Visit: Payer: Self-pay | Admitting: Internal Medicine

## 2018-06-09 DIAGNOSIS — E2839 Other primary ovarian failure: Secondary | ICD-10-CM

## 2018-06-16 ENCOUNTER — Other Ambulatory Visit: Payer: Self-pay | Admitting: Internal Medicine

## 2018-06-16 ENCOUNTER — Ambulatory Visit
Admission: RE | Admit: 2018-06-16 | Discharge: 2018-06-16 | Disposition: A | Discharge status: Home / Self Care | Payer: Medicare Other | Source: Ambulatory Visit | Attending: Internal Medicine | Admitting: Internal Medicine

## 2018-06-16 DIAGNOSIS — R059 Cough, unspecified: Secondary | ICD-10-CM

## 2018-06-16 DIAGNOSIS — R05 Cough: Secondary | ICD-10-CM

## 2018-06-17 ENCOUNTER — Ambulatory Visit
Admission: RE | Admit: 2018-06-17 | Discharge: 2018-06-17 | Disposition: A | Discharge status: Home / Self Care | Payer: Medicare Other | Source: Ambulatory Visit | Attending: Internal Medicine | Admitting: Internal Medicine

## 2018-06-17 DIAGNOSIS — E2839 Other primary ovarian failure: Secondary | ICD-10-CM

## 2019-01-06 ENCOUNTER — Ambulatory Visit: Payer: Medicare Other | Admitting: Podiatry

## 2019-03-08 ENCOUNTER — Other Ambulatory Visit: Payer: Self-pay | Admitting: Internal Medicine

## 2019-03-08 DIAGNOSIS — Z1231 Encounter for screening mammogram for malignant neoplasm of breast: Secondary | ICD-10-CM

## 2019-06-02 ENCOUNTER — Other Ambulatory Visit: Payer: Self-pay

## 2019-06-02 ENCOUNTER — Ambulatory Visit
Admission: RE | Admit: 2019-06-02 | Discharge: 2019-06-02 | Disposition: A | Discharge status: Home / Self Care | Payer: Medicare Other | Source: Ambulatory Visit | Attending: Internal Medicine | Admitting: Internal Medicine

## 2019-06-02 DIAGNOSIS — Z1231 Encounter for screening mammogram for malignant neoplasm of breast: Secondary | ICD-10-CM

## 2019-09-27 ENCOUNTER — Ambulatory Visit: Payer: Medicare Other | Admitting: Podiatry

## 2019-09-27 ENCOUNTER — Ambulatory Visit: Payer: Medicare Other

## 2019-09-27 ENCOUNTER — Other Ambulatory Visit: Payer: Self-pay

## 2019-09-27 ENCOUNTER — Encounter: Payer: Self-pay | Admitting: Podiatry

## 2019-09-27 VITALS — BP 136/81

## 2019-09-27 DIAGNOSIS — M79674 Pain in right toe(s): Secondary | ICD-10-CM

## 2019-09-27 DIAGNOSIS — M79675 Pain in left toe(s): Secondary | ICD-10-CM

## 2019-09-27 DIAGNOSIS — B351 Tinea unguium: Secondary | ICD-10-CM

## 2019-09-27 DIAGNOSIS — L84 Corns and callosities: Secondary | ICD-10-CM

## 2019-09-27 DIAGNOSIS — M79671 Pain in right foot: Secondary | ICD-10-CM

## 2019-09-27 DIAGNOSIS — M25572 Pain in left ankle and joints of left foot: Secondary | ICD-10-CM

## 2019-09-28 ENCOUNTER — Encounter: Payer: Self-pay | Admitting: Podiatry

## 2019-09-28 NOTE — Progress Notes (Signed)
  Subjective:  Patient ID: Ashlee Duarte, female    DOB: 21-Oct-1927,  MRN: [id number]  Chief Complaint  Patient presents with  . Nail Problem    pt is here for bil foot/ ankle pain, pt has some swelling around the heels, pt also has a possible hammer toe around the second toe   85 y.o. female returns for the above complaint.  Patient presents with elongated thickened dystrophic painful mycotic toenails x10.  Patient states that she has constant pain from these toenails rubbing against her shoes.  She would like someone to debride them and she is not able to do them anymore.  She also has secondary complaint of distal tip of the 3rd digit bilaterally callus formation which has been causing her a lot of pain with ambulation.  She has not tried any home medication or any alleviating treatment options.  She denies any other acute complaints.  She has been ambulating in regular sneakers without any assistive device.  Objective:   Vitals:   09/27/19 1402  BP: 136/81   Podiatric Exam: Vascular: dorsalis pedis and posterior tibial pulses are palpable bilateral. Capillary return is immediate. Temperature gradient is WNL. Skin turgor WNL  Sensorium: Normal Semmes Weinstein monofilament test. Normal tactile sensation bilaterally. Nail Exam: Pt has thick disfigured discolored nails with subungual debris noted bilateral entire nail hallux through fifth toenails Ulcer Exam: There is no evidence of ulcer or pre-ulcerative changes or infection. Orthopedic Exam: Muscle tone and strength are WNL. No limitations in general ROM. No crepitus or effusions noted. HAV  B/L.  Hammer toes 2-5  B/L. Skin: No Porokeratosis. No infection or ulcers  X-rays were taken: Left foot and ankle view 4 views of skeletally mature adult foot: No osseous abnormalities noted there is some arthritic changes at the ankle joint as well as dorsal aspect of the midfoot.  No other bony deformities noted.  No foreign body noted.   No soft tissue emphysema noted.  Assessment & Plan:  Patient was evaluated and treated and all questions answered.   Hyperkeratotic lesion noted submet 3 bilaterally distal tip/hallux IPJ of the right foot -Using a chisel blade and a handle, all hyperkeratotic lesions were aggressively debrided down to healthy striated tissue.  No complications noted no pinpoint bleeding noted.  Onychomycosis with pain  -Nails palliatively debrided as below. -Educated on self-care  Procedure: Nail Debridement Rationale: pain  Type of Debridement: manual, sharp debridement. Instrumentation: Nail nipper, rotary burr. Number of Nails: 10  Procedures and Treatment: Consent by patient was obtained for treatment procedures. The patient understood the discussion of treatment and procedures well. All questions were answered thoroughly reviewed. Debridement of mycotic and hypertrophic toenails, 1 through 5 bilateral and clearing of subungual debris. No ulceration, no infection noted.  Return Visit-Office Procedure: Patient instructed to return to the office for a follow up visit 3 months for continued evaluation and treatment.  Boneta Lucks, DPM    No follow-ups on file.

## 2019-11-01 ENCOUNTER — Ambulatory Visit: Payer: Federal, State, Local not specified - PPO | Attending: Internal Medicine

## 2019-11-01 DIAGNOSIS — Z23 Encounter for immunization: Secondary | ICD-10-CM

## 2019-11-01 NOTE — Progress Notes (Signed)
   Covid-19 Vaccination Clinic  Name:  Ashlee Duarte    MRN: [id number] DOB: [birth year]  11/01/2019  Ashlee Duarte was observed post Covid-19 immunization for 15 minutes without incidence. She was provided with Vaccine Information Sheet and instruction to access the V-Safe system.   Ashlee Duarte was instructed to call 911 with any severe reactions post vaccine: Marland Kitchen Difficulty breathing  . Swelling of your face and throat  . A fast heartbeat  . A bad rash all over your body  . Dizziness and weakness    Immunizations Administered    Name Date Dose VIS Date Route   Pfizer COVID-19 Vaccine 11/01/2019 11:24 AM 0.3 mL 09/17/2019 Intramuscular   Manufacturer: [employer], [employer]   Lot: [id number]   NDC: [id number]

## 2019-11-22 ENCOUNTER — Ambulatory Visit: Payer: Federal, State, Local not specified - PPO | Attending: Internal Medicine

## 2019-11-22 ENCOUNTER — Ambulatory Visit: Payer: Medicare Other | Admitting: Podiatry

## 2019-11-22 DIAGNOSIS — Z23 Encounter for immunization: Secondary | ICD-10-CM | POA: Insufficient documentation

## 2019-11-22 NOTE — Progress Notes (Signed)
   Covid-19 Vaccination Clinic  Name:  Ashlee Duarte    MRN: [id number] DOB: 03/15/1928  11/22/2019  Ashlee Duarte was observed post Covid-19 immunization for 15 minutes without incidence. She was provided with Vaccine Information Sheet and instruction to access the V-Safe system.   Ashlee Duarte was instructed to call 911 with any severe reactions post vaccine: Marland Kitchen Difficulty breathing  . Swelling of your face and throat  . A fast heartbeat  . A bad rash all over your body  . Dizziness and weakness    Immunizations Administered    Name Date Dose VIS Date Route   Pfizer COVID-19 Vaccine 11/22/2019 11:09 AM 0.3 mL 09/17/2019 Intramuscular   Manufacturer: [employer], [employer]   Lot: [id number]   NDC: [address]-8

## 2019-12-06 ENCOUNTER — Other Ambulatory Visit: Payer: Self-pay

## 2019-12-06 ENCOUNTER — Ambulatory Visit (INDEPENDENT_AMBULATORY_CARE_PROVIDER_SITE_OTHER): Payer: Medicare PPO | Admitting: Podiatry

## 2019-12-06 DIAGNOSIS — M79675 Pain in left toe(s): Secondary | ICD-10-CM | POA: Diagnosis not present

## 2019-12-06 DIAGNOSIS — R601 Generalized edema: Secondary | ICD-10-CM | POA: Diagnosis not present

## 2019-12-06 DIAGNOSIS — B351 Tinea unguium: Secondary | ICD-10-CM

## 2019-12-06 DIAGNOSIS — M79674 Pain in right toe(s): Secondary | ICD-10-CM | POA: Diagnosis not present

## 2019-12-06 DIAGNOSIS — Q828 Other specified congenital malformations of skin: Secondary | ICD-10-CM | POA: Diagnosis not present

## 2019-12-07 ENCOUNTER — Encounter: Payer: Self-pay | Admitting: Podiatry

## 2019-12-07 NOTE — Progress Notes (Signed)
  Subjective:  Patient ID: Ashlee Duarte, female    DOB: 1928-08-22,  MRN: [id number]  Chief Complaint  Patient presents with  . Ankle Pain    pt is here for bil ankle pain f/u, pt states that she still has ankle pain that she would like to have reconciled as well.   86 y.o. female returns for the above complaint.  Patient presents with elongated thickened dystrophic painful mycotic toenails x10.  Patient states that she has constant pain from these toenails rubbing against her shoes.  She would like someone to debride them and she is not able to do them anymore.  She also has secondary complaint of distal tip of the 3rd digit bilaterally callus formation which has been causing her a lot of pain with ambulation.  She would like to know if this could be debrided down as this is causing her a lot of pain.  She denies any other acute complaints.  Objective:   There were no vitals filed for this visit. Podiatric Exam: Vascular: dorsalis pedis and posterior tibial pulses are palpable bilateral. Capillary return is immediate. Temperature gradient is WNL. Skin turgor WNL  Sensorium: Normal Semmes Weinstein monofilament test. Normal tactile sensation bilaterally. Nail Exam: Pt has thick disfigured discolored nails with subungual debris noted bilateral entire nail hallux through fifth toenails Ulcer Exam: There is no evidence of ulcer or pre-ulcerative changes or infection. Orthopedic Exam: Muscle tone and strength are WNL. No limitations in general ROM. No crepitus or effusions noted. HAV  B/L.  Hammer toes 2-5  B/L. Skin: No Porokeratosis. No infection or ulcers  X-rays were taken: None  Assessment & Plan:  Patient was evaluated and treated and all questions answered.  Lower extremity edema bilaterally -I discussed with the patient the various treatment options associated with lower extremity edema with this etiology in relation to dependent position.  I explained to the patient that  aggressive elevation to the level of the heart and compression socks are the best way to reduce edema.  Patient states understanding will aggressively elevate as well as obtain compression socks.   Hyperkeratotic lesion noted submet 3 bilaterally distal tip/hallux IPJ of the right foot -Using a chisel blade and a handle, all hyperkeratotic lesions were aggressively debrided down to healthy striated tissue.  No complications noted no pinpoint bleeding noted.  Onychomycosis with pain  -Nails palliatively debrided as below. -Educated on self-care  Procedure: Nail Debridement Rationale: pain  Type of Debridement: manual, sharp debridement. Instrumentation: Nail nipper, rotary burr. Number of Nails: 10  Procedures and Treatment: Consent by patient was obtained for treatment procedures. The patient understood the discussion of treatment and procedures well. All questions were answered thoroughly reviewed. Debridement of mycotic and hypertrophic toenails, 1 through 5 bilateral and clearing of subungual debris. No ulceration, no infection noted.  Return Visit-Office Procedure: Patient instructed to return to the office for a follow up visit 3 months for continued evaluation and treatment.  Nicholes Rough, DPM    No follow-ups on file.

## 2020-03-13 ENCOUNTER — Ambulatory Visit: Payer: Federal, State, Local not specified - PPO | Admitting: Podiatry

## 2020-04-07 ENCOUNTER — Ambulatory Visit: Payer: Federal, State, Local not specified - PPO | Admitting: Podiatry

## 2020-04-14 ENCOUNTER — Ambulatory Visit: Payer: Federal, State, Local not specified - PPO | Admitting: Podiatry

## 2020-04-21 ENCOUNTER — Ambulatory Visit (INDEPENDENT_AMBULATORY_CARE_PROVIDER_SITE_OTHER): Payer: Medicare PPO | Admitting: Podiatry

## 2020-04-21 ENCOUNTER — Other Ambulatory Visit: Payer: Self-pay

## 2020-04-21 DIAGNOSIS — M79675 Pain in left toe(s): Secondary | ICD-10-CM | POA: Diagnosis not present

## 2020-04-21 DIAGNOSIS — M79674 Pain in right toe(s): Secondary | ICD-10-CM | POA: Diagnosis not present

## 2020-04-21 DIAGNOSIS — R601 Generalized edema: Secondary | ICD-10-CM

## 2020-04-21 DIAGNOSIS — B351 Tinea unguium: Secondary | ICD-10-CM

## 2020-04-24 ENCOUNTER — Other Ambulatory Visit: Payer: Self-pay | Admitting: Internal Medicine

## 2020-04-24 DIAGNOSIS — Z1231 Encounter for screening mammogram for malignant neoplasm of breast: Secondary | ICD-10-CM

## 2020-04-25 ENCOUNTER — Encounter: Payer: Self-pay | Admitting: Podiatry

## 2020-04-25 NOTE — Progress Notes (Signed)
.  kp   Subjective:  Patient ID: Ashlee Duarte, female    DOB: 26-Apr-1928,  MRN: [id number]  Chief Complaint  Patient presents with  . Leg Swelling    3 month follow up   86 y.o. female returns for the above complaint.  Patient presents with elongated thickened dystrophic painful mycotic toenails x10.  Patient states that she has constant pain from these toenails rubbing against her shoes.  She would like someone to debride them and she is not able to do them anymore.  Patient also has secondary complaint of bilateral lower extremity generalized edema 1+ pitting.  Patient states is somewhat painful to walk on.  She would like to discuss treatment options to help alleviate some of the swelling.  Objective:   There were no vitals filed for this visit. Podiatric Exam: Vascular: dorsalis pedis and posterior tibial pulses are palpable bilateral. Capillary return is immediate. Temperature gradient is WNL. Skin turgor WNL  Sensorium: Normal Semmes Weinstein monofilament test. Normal tactile sensation bilaterally. Nail Exam: Pt has thick disfigured discolored nails with subungual debris noted bilateral entire nail hallux through fifth toenails Ulcer Exam: There is no evidence of ulcer or pre-ulcerative changes or infection. Orthopedic Exam: Muscle tone and strength are WNL. No limitations in general ROM. No crepitus or effusions noted. HAV  B/L.  Hammer toes 2-5  B/L. Skin: No Porokeratosis. No infection or ulcers.  Bilateral lower extremity generalized swelling/edema 1+ pitting  X-rays were taken: None  Assessment & Plan:  Patient was evaluated and treated and all questions answered.  Lower extremity edema bilaterally -I discussed with the patient the various treatment options associated with lower extremity edema with this etiology in relation to dependent position.  I explained to the patient that aggressive elevation to the level of the heart and compression socks are the best way to  reduce edema.  Patient states understanding will aggressively elevate as well as obtain compression socks.   Hyperkeratotic lesion noted submet 3 bilaterally distal tip/hallux IPJ of the right foot -Using a chisel blade and a handle, all hyperkeratotic lesions were aggressively debrided down to healthy striated tissue.  No complications noted no pinpoint bleeding noted.  Onychomycosis with pain  -Nails palliatively debrided as below. -Educated on self-care  Procedure: Nail Debridement Rationale: pain  Type of Debridement: manual, sharp debridement. Instrumentation: Nail nipper, rotary burr. Number of Nails: 10  Procedures and Treatment: Consent by patient was obtained for treatment procedures. The patient understood the discussion of treatment and procedures well. All questions were answered thoroughly reviewed. Debridement of mycotic and hypertrophic toenails, 1 through 5 bilateral and clearing of subungual debris. No ulceration, no infection noted.  Return Visit-Office Procedure: Patient instructed to return to the office for a follow up visit 3 months for continued evaluation and treatment.  Nicholes Rough, DPM    No follow-ups on file.

## 2020-06-07 ENCOUNTER — Ambulatory Visit
Admission: RE | Admit: 2020-06-07 | Discharge: 2020-06-07 | Disposition: A | Discharge status: Home / Self Care | Payer: Federal, State, Local not specified - PPO | Source: Ambulatory Visit | Attending: Internal Medicine | Admitting: Internal Medicine

## 2020-06-07 ENCOUNTER — Other Ambulatory Visit: Payer: Self-pay

## 2020-06-07 DIAGNOSIS — Z1231 Encounter for screening mammogram for malignant neoplasm of breast: Secondary | ICD-10-CM

## 2020-07-26 ENCOUNTER — Ambulatory Visit (INDEPENDENT_AMBULATORY_CARE_PROVIDER_SITE_OTHER): Payer: Medicare PPO | Admitting: Podiatry

## 2020-07-26 ENCOUNTER — Other Ambulatory Visit: Payer: Self-pay

## 2020-07-26 DIAGNOSIS — M79675 Pain in left toe(s): Secondary | ICD-10-CM

## 2020-07-26 DIAGNOSIS — L853 Xerosis cutis: Secondary | ICD-10-CM

## 2020-07-26 DIAGNOSIS — M79674 Pain in right toe(s): Secondary | ICD-10-CM | POA: Diagnosis not present

## 2020-07-26 DIAGNOSIS — B351 Tinea unguium: Secondary | ICD-10-CM

## 2020-07-26 NOTE — Progress Notes (Signed)
r 

## 2020-07-27 ENCOUNTER — Encounter: Payer: Self-pay | Admitting: Podiatry

## 2020-07-27 NOTE — Progress Notes (Signed)
.  kp   Subjective:  Patient ID: Ashlee Duarte, female    DOB: July 19, 1928,  MRN: [id number]  Chief Complaint  Patient presents with  . routine foot care    nail trim   86 y.o. female returns for the above complaint.  Patient presents with elongated thickened dystrophic painful mycotic toenails x10.  Patient states that she has constant pain from these toenails rubbing against her shoes.  She would like someone to debride them and she is not able to do them anymore.  She would like to also discuss dry skin to both lower extremity.  She states that she applies lotion but not religiously.  She denies any other treatment options.  She just wants to discuss other treatment options  Objective:   There were no vitals filed for this visit. Podiatric Exam: Vascular: dorsalis pedis and posterior tibial pulses are palpable bilateral. Capillary return is immediate. Temperature gradient is WNL. Skin turgor WNL  Sensorium: Normal Semmes Weinstein monofilament test. Normal tactile sensation bilaterally. Nail Exam: Pt has thick disfigured discolored nails with subungual debris noted bilateral entire nail hallux through fifth toenails Ulcer Exam: There is no evidence of ulcer or pre-ulcerative changes or infection. Orthopedic Exam: Muscle tone and strength are WNL. No limitations in general ROM. No crepitus or effusions noted. HAV  B/L.  Hammer toes 2-5  B/L. Skin: No Porokeratosis. No infection or ulcers.  Edema has improved.  Xerosis/dry skin without itching noted mildly to bilateral lower extremity  X-rays were taken: None  Assessment & Plan:  Patient was evaluated and treated and all questions answered.  Lower extremity edema bilaterally -Clinically improved and resolved  Xerosis bilateral lower extremity mild -I explained to the patient the etiology of xerosis and various treatment options were extensively discussed.  I explained to the patient the importance of maintaining moisturization  of the skin with application of over-the-counter lotion such as Eucerin or Luciderm.  I have asked the patient to apply this twice a day.  If unable to resolve patient will benefit from prescription lotion.   Hyperkeratotic lesion noted submet 3 bilaterally distal tip/hallux IPJ of the right foot -Using a chisel blade and a handle, all hyperkeratotic lesions were aggressively debrided down to healthy striated tissue.  No complications noted no pinpoint bleeding noted.  Onychomycosis with pain  -Nails palliatively debrided as below. -Educated on self-care  Procedure: Nail Debridement Rationale: pain  Type of Debridement: manual, sharp debridement. Instrumentation: Nail nipper, rotary burr. Number of Nails: 10  Procedures and Treatment: Consent by patient was obtained for treatment procedures. The patient understood the discussion of treatment and procedures well. All questions were answered thoroughly reviewed. Debridement of mycotic and hypertrophic toenails, 1 through 5 bilateral and clearing of subungual debris. No ulceration, no infection noted.  Return Visit-Office Procedure: Patient instructed to return to the office for a follow up visit 3 months for continued evaluation and treatment.  Nicholes Rough, DPM    No follow-ups on file.

## 2020-08-22 ENCOUNTER — Ambulatory Visit: Payer: Federal, State, Local not specified - PPO | Attending: Internal Medicine

## 2020-08-22 DIAGNOSIS — Z23 Encounter for immunization: Secondary | ICD-10-CM

## 2020-08-22 NOTE — Progress Notes (Signed)
   Covid-19 Vaccination Clinic  Name:  Akaisha Truman    MRN: [id number] DOB: 1928-02-11  08/22/2020  Ms. Haggard was observed post Covid-19 immunization for 15 minutes without incident. She was provided with Vaccine Information Sheet and instruction to access the V-Safe system.   Ms. Wivell was instructed to call 911 with any severe reactions post vaccine: Marland Kitchen Difficulty breathing  . Swelling of face and throat  . A fast heartbeat  . A bad rash all over body  . Dizziness and weakness   Immunizations Administered    Name Date Dose VIS Date Route   Pfizer COVID-19 Vaccine 08/22/2020  1:11 PM 0.3 mL 07/26/2020 Intramuscular   Manufacturer: [employer], [employer]   Lot: [id number]   NDC: [address]-2

## 2020-10-25 ENCOUNTER — Ambulatory Visit: Payer: Federal, State, Local not specified - PPO | Admitting: Podiatry

## 2020-12-08 ENCOUNTER — Ambulatory Visit: Payer: Federal, State, Local not specified - PPO | Admitting: Podiatry

## 2020-12-29 ENCOUNTER — Encounter: Payer: Self-pay | Admitting: Podiatry

## 2020-12-29 ENCOUNTER — Other Ambulatory Visit: Payer: Self-pay

## 2020-12-29 ENCOUNTER — Ambulatory Visit (INDEPENDENT_AMBULATORY_CARE_PROVIDER_SITE_OTHER): Payer: Medicare PPO | Admitting: Podiatry

## 2020-12-29 DIAGNOSIS — M79674 Pain in right toe(s): Secondary | ICD-10-CM

## 2020-12-29 DIAGNOSIS — Q828 Other specified congenital malformations of skin: Secondary | ICD-10-CM | POA: Diagnosis not present

## 2020-12-29 DIAGNOSIS — B351 Tinea unguium: Secondary | ICD-10-CM

## 2020-12-29 DIAGNOSIS — M79672 Pain in left foot: Secondary | ICD-10-CM

## 2020-12-29 DIAGNOSIS — M79675 Pain in left toe(s): Secondary | ICD-10-CM

## 2020-12-29 DIAGNOSIS — M79671 Pain in right foot: Secondary | ICD-10-CM | POA: Diagnosis not present

## 2020-12-29 DIAGNOSIS — L84 Corns and callosities: Secondary | ICD-10-CM

## 2020-12-29 NOTE — Patient Instructions (Signed)
Purchase Skechers shoes with stretchable uppers and memory foam insoles at [employer] or [email or web address]. Also available on [employer].

## 2020-12-31 NOTE — Progress Notes (Signed)
Subjective:  Patient ID: Ashlee Duarte, female    DOB: 1928-08-28,  MRN: [id number]  Ashlee Duarte presents to clinic today for corn(s) b/l , callus(es) b/l and painful mycotic nails.  Pain interferes with ambulation. Aggravating factors include wearing enclosed shoe gear. Painful toenails interfere with ambulation. Aggravating factors include wearing enclosed shoe gear. Pain is relieved with periodic professional debridement. Painful corns and calluses are aggravated when weightbearing with and without shoegear. Pain is relieved with periodic professional debridement..   Current Outpatient Medications:  .  alendronate (FOSAMAX) 10 MG tablet, Take 10 mg by mouth daily before breakfast. Take with a full glass of water on an empty stomach., Disp: , Rfl:  .  amoxicillin (AMOXIL) 500 MG capsule, Take 1,000 mg by mouth 2 (two) times daily., Disp: , Rfl:  .  aspirin 81 MG tablet, Take 81 mg by mouth daily., Disp: , Rfl:  .  atorvastatin (LIPITOR) 10 MG tablet, Take 10 mg by mouth daily., Disp: , Rfl:  .  hydrochlorothiazide (HYDRODIURIL) 25 MG tablet, Take 25 mg by mouth daily., Disp: , Rfl:  .  KLOR-CON M10 10 MEQ tablet, , Disp: , Rfl:  .  losartan (COZAAR) 25 MG tablet, Take 25 mg by mouth daily., Disp: , Rfl:  .  meclizine (ANTIVERT) 25 MG tablet, Take 25 mg by mouth 2 (two) times daily., Disp: , Rfl:  .  methylPREDNISolone (MEDROL DOSEPAK) 4 MG tablet, Take by mouth. follow package directions, Disp: , Rfl:   Allergies  Allergen Reactions  . Naproxen Sodium Swelling    Review of Systems: Negative except as noted in the HPI. Objective:   Constitutional Ashlee Duarte is a pleasant 87 y.o. African American female, in NAD. AAO x 3.   Vascular Capillary refill time to digits immediate b/l. Palpable pedal pulses b/l LE. Pedal hair absent. Lower extremity skin temperature gradient within normal limits. No cyanosis or clubbing noted.  Neurologic Normal speech.  Oriented to person, place, and time. Protective sensation intact 5/5 intact bilaterally with 10g monofilament b/l. Vibratory sensation intact b/l.  Dermatologic Pedal skin with normal turgor, texture and tone bilaterally. No open wounds bilaterally. No interdigital macerations bilaterally. Toenails 1-5 b/l elongated, discolored, dystrophic, thickened, crumbly with subungual debris and tenderness to dorsal palpation. Hyperkeratotic lesion(s) L 3rd toe, L 5th toe, R 3rd toe, R 5th toe, submet head 5 left foot and submet head 5 right foot.  No erythema, no edema, no drainage, no fluctuance. Porokeratotic lesion(s) lateral aspect of both feet and plantar right heel. No erythema, no edema, no drainage, no fluctuance.  Orthopedic: Normal muscle strength 5/5 to all lower extremity muscle groups bilaterally. No pain crepitus or joint limitation noted with ROM b/l. Hallux valgus with bunion deformity noted b/l lower extremities. Hammertoes noted to the 2-5 bilaterally.   Radiographs: None Assessment:  No diagnosis found. Plan:  Patient was evaluated and treated and all questions answered.  Onychomycosis with pain -Nails palliatively debridement as below -Educated on self-care  Procedure: Nail Debridement Rationale: Pain Type of Debridement: manual, sharp debridement. Instrumentation: Nail nipper, rotary burr. Number of Nails: 10 -Examined patient. -Patient to continue soft, supportive shoe gear daily. -Toenails 1-5 b/l were debrided in length and girth with sterile nail nippers and dremel without iatrogenic bleeding.  -Corn(s) L 3rd toe, L 5th toe, R 3rd toe and R 5th toe and callus(es) submet head 5 left foot and submet head 5 right foot were pared utilizing sterile scalpel blade without incident. Total  number debrided =6. -Painful porokeratotic lesion(s) lateral aspect of both feet and plantar right heel pared and enucleated with sterile scalpel blade without incident. -Patient to report any pedal  injuries to medical professional immediately. -Recommended Skechers with stretchable uppers and memory foam insoles. Information printed out for her. -Patient/POA to call should there be question/concern in the interim.  Return in about 3 months (around 03/31/2021).  Freddie Breech, DPM

## 2021-02-27 ENCOUNTER — Ambulatory Visit: Payer: Federal, State, Local not specified - PPO | Admitting: Podiatry

## 2021-03-06 ENCOUNTER — Other Ambulatory Visit: Payer: Self-pay | Admitting: Internal Medicine

## 2021-03-06 DIAGNOSIS — Z1231 Encounter for screening mammogram for malignant neoplasm of breast: Secondary | ICD-10-CM

## 2021-03-16 ENCOUNTER — Ambulatory Visit: Payer: Federal, State, Local not specified - PPO | Admitting: Podiatry

## 2021-04-17 ENCOUNTER — Ambulatory Visit (INDEPENDENT_AMBULATORY_CARE_PROVIDER_SITE_OTHER): Payer: Medicare PPO | Admitting: Podiatry

## 2021-04-17 ENCOUNTER — Encounter: Payer: Self-pay | Admitting: Podiatry

## 2021-04-17 ENCOUNTER — Other Ambulatory Visit: Payer: Self-pay

## 2021-04-17 DIAGNOSIS — Q828 Other specified congenital malformations of skin: Secondary | ICD-10-CM

## 2021-04-17 DIAGNOSIS — B351 Tinea unguium: Secondary | ICD-10-CM

## 2021-04-17 DIAGNOSIS — M79675 Pain in left toe(s): Secondary | ICD-10-CM

## 2021-04-17 DIAGNOSIS — M79671 Pain in right foot: Secondary | ICD-10-CM | POA: Diagnosis not present

## 2021-04-17 DIAGNOSIS — L84 Corns and callosities: Secondary | ICD-10-CM

## 2021-04-17 DIAGNOSIS — M79674 Pain in right toe(s): Secondary | ICD-10-CM | POA: Diagnosis not present

## 2021-04-17 DIAGNOSIS — M79672 Pain in left foot: Secondary | ICD-10-CM

## 2021-04-20 NOTE — Progress Notes (Signed)
Subjective: Ashlee Duarte is a pleasant 87 y.o. female patient seen today for painful corns/calluses of both feet and painful thick toenails that are difficult to trim. Pain interferes with ambulation. Aggravating factors include wearing enclosed shoe gear. Pain is relieved with periodic professional debridement.  She voices no new pedal concerns on today's visit.  PCP is Burton Apley, MD. Last visit was: two months ago.  Allergies  Allergen Reactions   Naproxen Sodium Swelling    Objective: Physical Exam  General: Ashlee Duarte is a pleasant 87 y.o. African American female, WD, WN in NAD. AAO x 3.   Vascular:  Capillary refill time to digits immediate b/l. Palpable DP pulse(s) b/l lower extremities Palpable PT pulse(s) b/l lower extremities Pedal hair absent. Lower extremity skin temperature gradient within normal limits. No edema noted b/l lower extremities.  Dermatological:  Pedal skin with normal turgor, texture and tone b/l lower extremities No open wounds b/l lower extremities No interdigital macerations b/l lower extremities Toenails 1-5 b/l elongated, discolored, dystrophic, thickened, crumbly with subungual debris and tenderness to dorsal palpation. Hyperkeratotic lesion(s) L 3rd toe, L 5th toe, R 3rd toe, R 5th toe, submet head 5 left foot, and submet head 5 right foot.  No erythema, no edema, no drainage, no fluctuance. Porokeratotic lesion(s) lateral aspect of both feet and plantar aspect of heel right foot. No erythema, no edema, no drainage, no fluctuance.  Musculoskeletal:  Normal muscle strength 5/5 to all lower extremity muscle groups bilaterally. No pain crepitus or joint limitation noted with ROM b/l. Hallux valgus with bunion deformity noted b/l lower extremities. Hammertoe(s) noted to the 2-5 bilaterally.  Neurological:  Protective sensation intact 5/5 intact bilaterally with 10g monofilament b/l. Vibratory sensation intact b/l.  Assessment  and Plan:  1. Pain due to onychomycosis of toenails of both feet   2. Corns and callosities   3. Porokeratosis   4. Pain in both feet      -Examined patient. -Patient to continue soft, supportive shoe gear daily. -Toenails 1-5 b/l were debrided in length and girth with sterile nail nippers and dremel without iatrogenic bleeding.  -Corn(s) L 3rd toe, L 5th toe, R 3rd toe, and R 5th toe and callus(es) submet head 5 left foot and submet head 5 right foot were pared utilizing sterile scalpel blade without incident. Total number debrided =6. -Painful porokeratotic lesion(s) b/l feet and plantar aspect of heel right foot pared and enucleated with sterile scalpel blade without incident. Total number of lesions debrided=3. -Patient to report any pedal injuries to medical professional immediately. -Patient/POA to call should there be question/concern in the interim.  Return in about 3 months (around 07/18/2021).  Ashlee Duarte, DPM

## 2021-06-08 ENCOUNTER — Ambulatory Visit
Admission: RE | Admit: 2021-06-08 | Discharge: 2021-06-08 | Disposition: A | Discharge status: Home / Self Care | Payer: Medicare PPO | Source: Ambulatory Visit | Attending: Internal Medicine | Admitting: Internal Medicine

## 2021-06-08 ENCOUNTER — Other Ambulatory Visit: Payer: Self-pay

## 2021-06-08 DIAGNOSIS — Z1231 Encounter for screening mammogram for malignant neoplasm of breast: Secondary | ICD-10-CM

## 2021-07-17 ENCOUNTER — Ambulatory Visit (INDEPENDENT_AMBULATORY_CARE_PROVIDER_SITE_OTHER): Payer: Medicare PPO | Admitting: Podiatry

## 2021-07-17 ENCOUNTER — Other Ambulatory Visit: Payer: Self-pay

## 2021-07-17 ENCOUNTER — Encounter: Payer: Self-pay | Admitting: Podiatry

## 2021-07-17 DIAGNOSIS — M79671 Pain in right foot: Secondary | ICD-10-CM

## 2021-07-17 DIAGNOSIS — M79675 Pain in left toe(s): Secondary | ICD-10-CM

## 2021-07-17 DIAGNOSIS — M79672 Pain in left foot: Secondary | ICD-10-CM

## 2021-07-17 DIAGNOSIS — M79674 Pain in right toe(s): Secondary | ICD-10-CM | POA: Diagnosis not present

## 2021-07-17 DIAGNOSIS — Q828 Other specified congenital malformations of skin: Secondary | ICD-10-CM | POA: Diagnosis not present

## 2021-07-17 DIAGNOSIS — B351 Tinea unguium: Secondary | ICD-10-CM | POA: Diagnosis not present

## 2021-07-17 DIAGNOSIS — L84 Corns and callosities: Secondary | ICD-10-CM | POA: Diagnosis not present

## 2021-07-21 NOTE — Progress Notes (Signed)
Subjective: Ashlee Duarte is a 87 y.o. female patient seen today for follow up of  painful thick toenails that are difficult to trim and painful corns b/l feet. Pain interferes with ambulation. Aggravating factors include wearing enclosed shoe gear. Pain is relieved with periodic professional debridement.  New problems reported today: None.  She notes no new pedal problems on today's visit. She has been wearing softer shoes.   Allergies  Allergen Reactions   Naproxen Sodium Swelling   Objective: Physical Exam  General: Patient is a pleasant 87 y.o. African American female WD, WN in NAD. AAO x 3.   Neurovascular Examination: Capillary refill time to digits immediate b/l lower extremities. Palpable pedal pulses b/l LE. Pedal hair absent. Lower extremity skin temperature gradient within normal limits. No pain with calf compression b/l. No edema noted b/l lower extremities.  Protective sensation intact 5/5 intact bilaterally with 10g monofilament b/l. Vibratory sensation intact b/l.  Dermatological:  Skin warm and supple b/l lower extremities. No open wounds b/l LE. No interdigital macerations b/l lower extremities. Toenails 1-5 b/l elongated, discolored, dystrophic, thickened, crumbly with subungual debris and tenderness to dorsal palpation. Hyperkeratotic lesion(s) L 3rd toe, L 5th toe, R 5th toe, submet head 5 left foot, and submet head 5 right foot.  No erythema, no edema, no drainage, no fluctuance. Porokeratotic lesion(s) plantar aspect of heel right foot. No erythema, no edema, no drainage, no fluctuance.  Musculoskeletal:  Normal muscle strength 5/5 to all lower extremity muscle groups bilaterally. Hallux valgus with bunion deformity noted b/l lower extremities. Hammertoe(s) noted to the 2-5 bilaterally.  Assessment: 1. Pain due to onychomycosis of toenails of both feet   2. Corns and callosities   3. Porokeratosis   4. Pain in both feet    Plan: Patient was evaluated  and treated and all questions answered. Consent given for treatment as described below: -Patient to continue soft, supportive shoe gear daily. -Toenails 1-5 b/l were debrided in length and girth with sterile nail nippers and dremel without iatrogenic bleeding.  -Corn(s) L 3rd toe, L 5th toe, and R 3rd toe and callus(es) submet head 5 left foot and submet head 5 right foot were pared utilizing sterile scalpel blade without incident. Total number debrided =5. -Painful porokeratotic lesion(s) plantar aspect of heel right foot pared and enucleated with sterile scalpel blade without incident. Total number of lesions debrided=1. -Patient to report any pedal injuries to medical professional immediately. -Patient/POA to call should there be question/concern in the interim.  Return in about 3 months (around 10/17/2021).  Freddie Breech, DPM

## 2021-10-30 ENCOUNTER — Ambulatory Visit (INDEPENDENT_AMBULATORY_CARE_PROVIDER_SITE_OTHER): Payer: Self-pay | Admitting: Podiatry

## 2021-10-30 DIAGNOSIS — Z91199 Patient's noncompliance with other medical treatment and regimen due to unspecified reason: Secondary | ICD-10-CM

## 2021-11-01 NOTE — Progress Notes (Signed)
   Complete physical exam  Patient: Ashlee Duarte   DOB: 07/27/1997   88 y.o. Female  MRN: [id number]  Subjective:    No chief complaint on file.   Ashlee Duarte is a 88 y.o. female who presents today for a complete physical exam. She reports consuming a {diet types:17450} diet. {types:19826} She generally feels {DESC; WELL/FAIRLY WELL/POORLY:18703}. She reports sleeping {DESC; WELL/FAIRLY WELL/POORLY:18703}. She {does/does not:200015} have additional problems to discuss today.    Most recent fall risk assessment:    04/03/2022   10:42 AM  Fall Risk   Falls in the past year? 0  Number falls in past yr: 0  Injury with Fall? 0  Risk for fall due to : No Fall Risks  Follow up Falls evaluation completed     Most recent depression screenings:    04/03/2022   10:42 AM 02/22/2021   10:46 AM  PHQ 2/9 Scores  PHQ - 2 Score 0 0  PHQ- 9 Score 5     {VISON DENTAL STD PSA (Optional):27386}  {History (Optional):23778}  Patient Care Team: Jessup, Joy, NP as PCP - General (Nurse Practitioner)   Outpatient Medications Prior to Visit  Medication Sig   fluticasone (FLONASE) 50 MCG/ACT nasal spray Place 2 sprays into both nostrils in the morning and at bedtime. After 7 days, reduce to once daily.   norgestimate-ethinyl estradiol (SPRINTEC 28) 0.25-35 MG-MCG tablet Take 1 tablet by mouth daily.   Nystatin POWD Apply liberally to affected area 2 times per day   spironolactone (ALDACTONE) 100 MG tablet Take 1 tablet (100 mg total) by mouth daily.   No facility-administered medications prior to visit.    ROS        Objective:     There were no vitals taken for this visit. {Vitals History (Optional):23777}  Physical Exam   No results found for any visits on 05/09/22. {Show previous labs (optional):23779}    Assessment & Plan:    Routine Health Maintenance and Physical Exam  Immunization History  Administered Date(s) Administered   DTaP 10/10/1999, 12/06/1999,  02/14/2000, 10/30/2000, 05/15/2004   Hepatitis A 03/11/2008, 03/17/2009   Hepatitis B 07/28/1999, 09/04/1999, 02/14/2000   HiB (PRP-OMP) 10/10/1999, 12/06/1999, 02/14/2000, 10/30/2000   IPV 10/10/1999, 12/06/1999, 08/04/2000, 05/15/2004   Influenza,inj,Quad PF,6+ Mos 06/17/2014   Influenza-Unspecified 09/16/2012   MMR 08/04/2001, 05/15/2004   Meningococcal Polysaccharide 03/16/2012   Pneumococcal Conjugate-13 10/30/2000   Pneumococcal-Unspecified 02/14/2000, 04/29/2000   Tdap 03/16/2012   Varicella 08/04/2000, 03/11/2008    Health Maintenance  Topic Date Due   HIV Screening  Never done   Hepatitis C Screening  Never done   INFLUENZA VACCINE  05/07/2022   PAP-Cervical Cytology Screening  05/09/2022 (Originally 07/26/2020)   PAP SMEAR-Modifier  05/09/2022 (Originally 07/26/2020)   TETANUS/TDAP  05/09/2022 (Originally 03/16/2022)   HPV VACCINES  Discontinued   COVID-19 Vaccine  Discontinued    Discussed health benefits of physical activity, and encouraged her to engage in regular exercise appropriate for her age and condition.  Problem List Items Addressed This Visit   None Visit Diagnoses     Annual physical exam    -  Primary   Cervical cancer screening       Need for Tdap vaccination          No follow-ups on file.     Joy Jessup, NP   

## 2021-11-06 ENCOUNTER — Other Ambulatory Visit: Payer: Self-pay

## 2021-11-06 ENCOUNTER — Encounter: Payer: Self-pay | Admitting: Podiatry

## 2021-11-06 ENCOUNTER — Ambulatory Visit (INDEPENDENT_AMBULATORY_CARE_PROVIDER_SITE_OTHER): Payer: Medicare PPO | Admitting: Podiatry

## 2021-11-06 DIAGNOSIS — B351 Tinea unguium: Secondary | ICD-10-CM

## 2021-11-06 DIAGNOSIS — M79671 Pain in right foot: Secondary | ICD-10-CM

## 2021-11-06 DIAGNOSIS — M79674 Pain in right toe(s): Secondary | ICD-10-CM | POA: Diagnosis not present

## 2021-11-06 DIAGNOSIS — Q828 Other specified congenital malformations of skin: Secondary | ICD-10-CM

## 2021-11-06 DIAGNOSIS — M79672 Pain in left foot: Secondary | ICD-10-CM | POA: Diagnosis not present

## 2021-11-06 DIAGNOSIS — M79675 Pain in left toe(s): Secondary | ICD-10-CM

## 2021-11-06 DIAGNOSIS — L84 Corns and callosities: Secondary | ICD-10-CM | POA: Diagnosis not present

## 2021-11-11 NOTE — Progress Notes (Signed)
°  Subjective:  Patient ID: Ashlee Duarte, female    DOB: 05/10/1928,  MRN: [id number]  Ashlee Duarte presents to clinic today for corn(s) of both feet , callus(es) of both feet, and painful porokeratotic lesion right heel. and painful mycotic nails.  Pain interferes with ambulation. Aggravating factors include wearing enclosed shoe gear. Painful toenails interfere with ambulation. Aggravating factors include wearing enclosed shoe gear. Pain is relieved with periodic professional debridement. Painful corns and calluses are aggravated when weightbearing with and without shoegear. Pain is relieved with periodic professional debridement.   Painful porokeratosis prevents comfortable ambulation. Aggravating factor is weightbearing with or without shoegear.  New problem(s): None.   PCP is Burton Apley, MD.  Allergies  Allergen Reactions   Naproxen Sodium Swelling    Review of Systems: Negative except as noted in the HPI. Objective:   Constitutional Ashlee Duarte is a pleasant 88 y.o. African American female, WD, WN in NAD. AAO x 3.   Vascular CFT immediate b/l LE. Palpable DP/PT pulses b/l LE. Digital hair absent b/l. Skin temperature gradient WNL b/l. No pain with calf compression b/l. No edema noted b/l. No cyanosis or clubbing noted b/l LE.  Neurologic Normal speech. Oriented to person, place, and time. Protective sensation intact 5/5 intact bilaterally with 10g monofilament b/l. Vibratory sensation intact b/l.  Dermatologic Pedal integument with normal turgor, texture and tone b/l LE. No open wounds b/l. No interdigital macerations b/l. Toenails 1-5 b/l elongated, thickened, discolored with subungual debris. +Tenderness with dorsal palpation of nailplates. Hyperkeratotic lesion(s) noted bilateral 3rd toes and submet head 5 b/l. Porokeratotic lesion(s) plantar heel pad of right foot. No erythema, no edema, no drainage, no fluctuance.  Orthopedic: Normal muscle  strength 5/5 to all lower extremity muscle groups bilaterally. HAV with bunion deformity noted b/l LE. Hammertoe deformity noted 2-5 b/l.Marland Kitchen No pain, crepitus or joint limitation noted with ROM b/l LE.  Patient ambulates independently without assistive aids.   Radiographs: None  Last A1c: No flowsheet data found.   Assessment:   1. Pain due to onychomycosis of toenails of both feet   2. Corns and callosities   3. Porokeratosis   4. Pain in both feet    Plan:  -Examined patient. -Mycotic toenails 1-5 bilaterally were debrided in length and girth with sterile nail nippers and dremel without incident. -Corn(s) bilateral 3rd toes and L 5th toe and callus(es) submet head 5 b/l were pared utilizing sterile scalpel blade without incident. Total number debrided =5. -Painful porokeratotic lesion(s) plantar heel pad of right foot pared and enucleated with sterile scalpel blade without incident. Total number of lesions debrided=1. -Patient/POA to call should there be question/concern in the interim.  Return in about 9 weeks (around 01/08/2022).  Freddie Breech, DPM

## 2021-12-10 IMAGING — MG MM DIGITAL SCREENING BILAT W/ TOMO AND CAD
8 series · 8 of 24 positions shown · non-contrast
Comparison: Previous exam(s).

CLINICAL DATA: Screening.

EXAM:
DIGITAL SCREENING BILATERAL MAMMOGRAM WITH TOMOSYNTHESIS AND CAD
TECHNIQUE: Bilateral screening digital craniocaudal and mediolateral oblique
mammograms were obtained. Bilateral screening digital breast
tomosynthesis was performed. The images were evaluated with
computer-aided detection.

[L CC synth-2D]
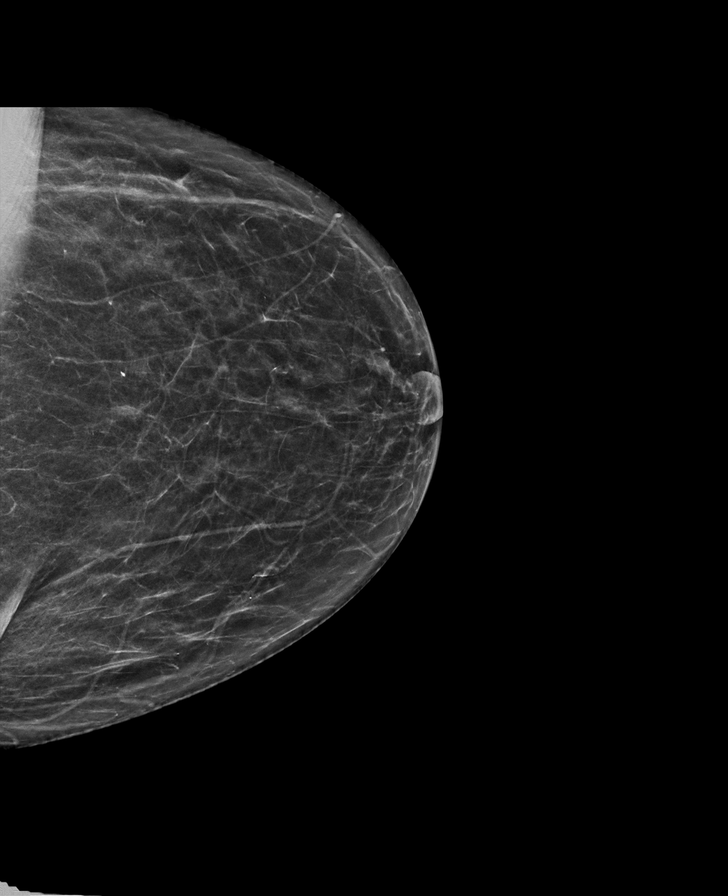

[R CC synth-2D]
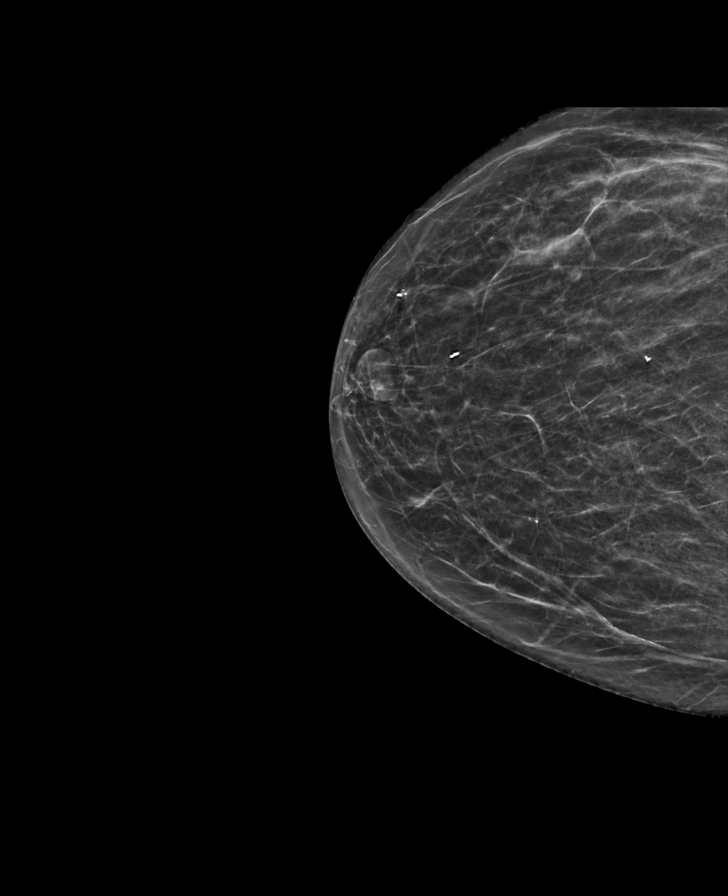

[L MLO synth-2D]
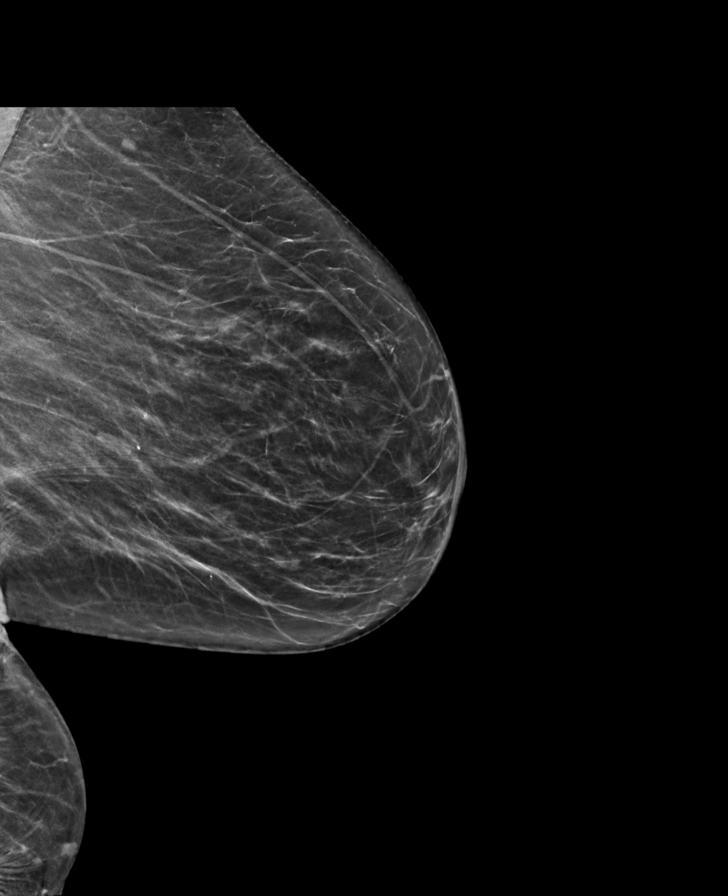

[R MLO synth-2D]
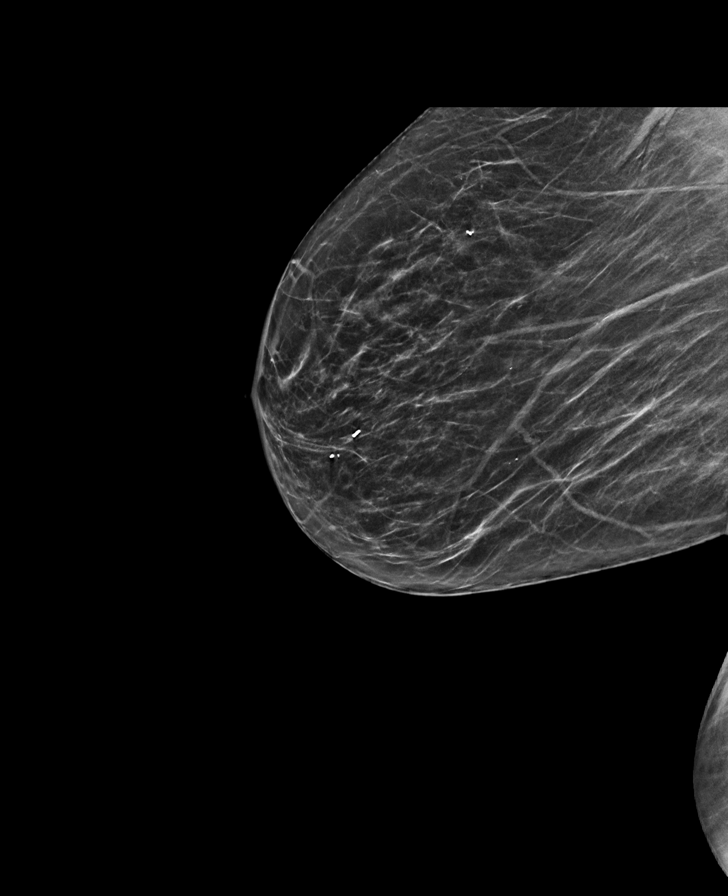

[R MLO tomo · tomo slice 27/54.0]
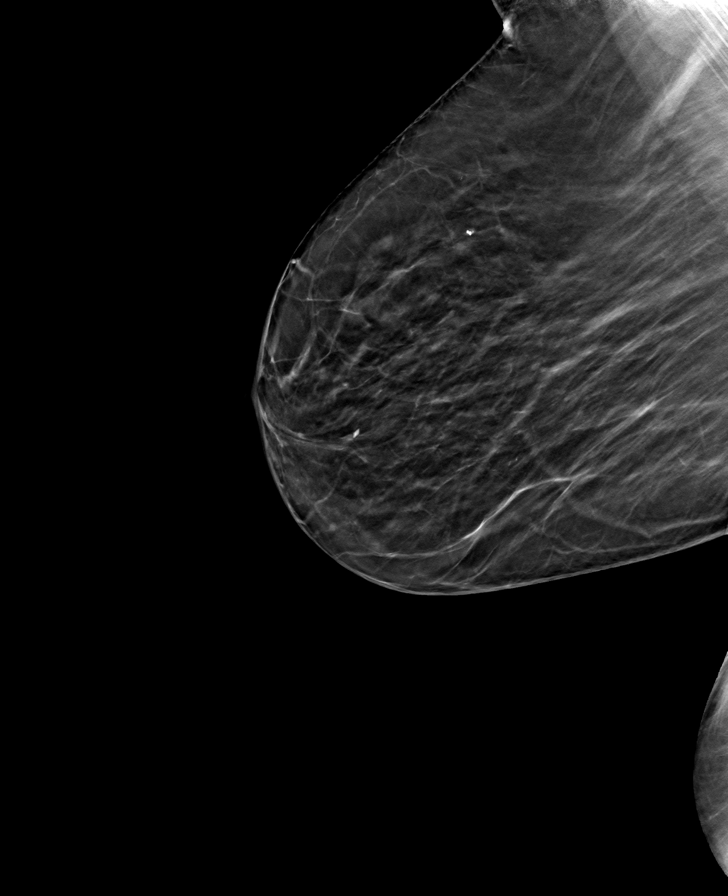

[L CC tomo · tomo slice 27/54.0]
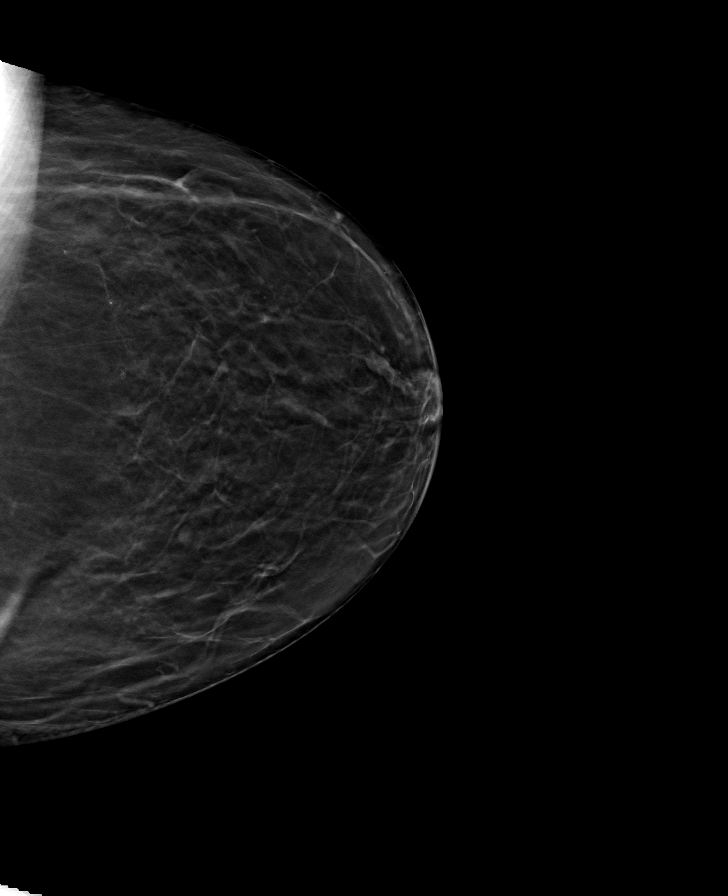

[R CC tomo · tomo slice 24/47.0]
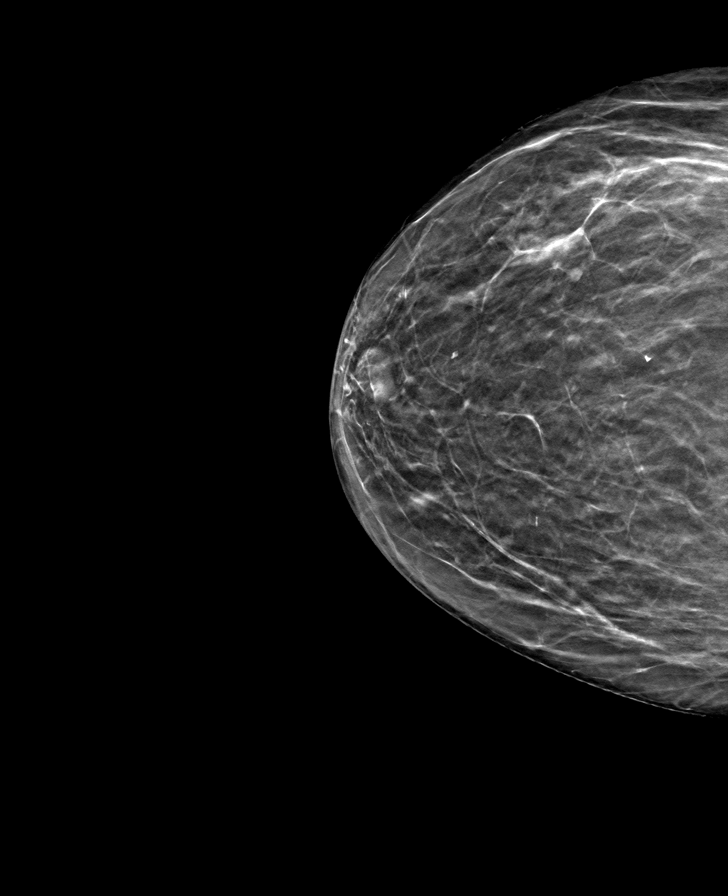

[L MLO tomo · tomo slice 31/61.0]
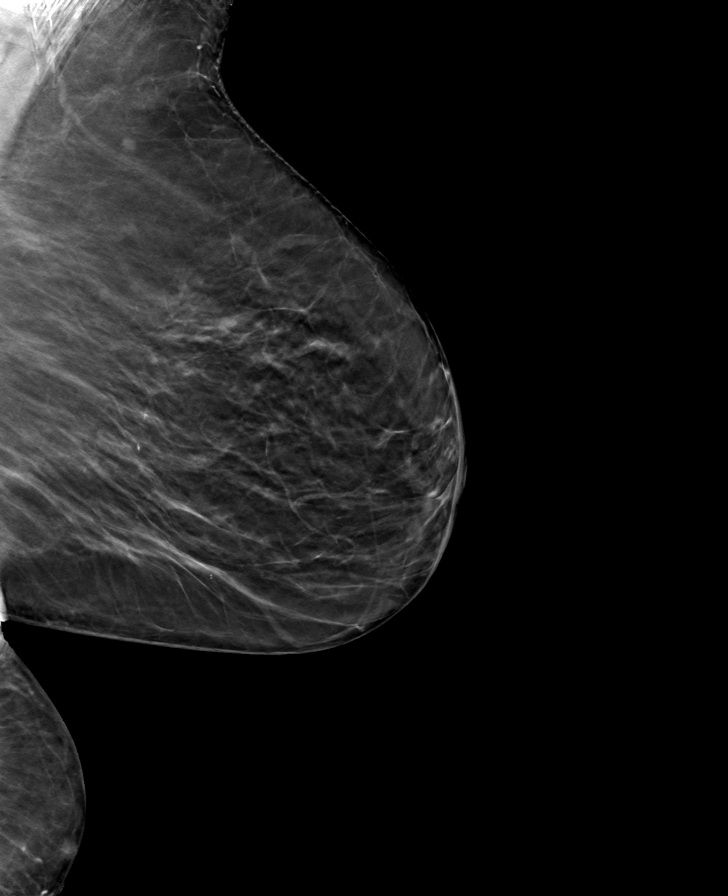

[8 of 24 positions shown; findings below may reference images not displayed]

ACR Breast Density Category b: There are scattered areas of
fibroglandular density.
FINDINGS: There are no findings suspicious for malignancy.
IMPRESSION: No mammographic evidence of malignancy. A result letter of this
screening mammogram will be mailed directly to the patient.

RECOMMENDATION:
Screening mammogram in one year. (Code:51-O-LD2)

BI-RADS CATEGORY  1: Negative.

## 2022-01-08 ENCOUNTER — Ambulatory Visit (INDEPENDENT_AMBULATORY_CARE_PROVIDER_SITE_OTHER): Payer: Medicare PPO | Admitting: Podiatry

## 2022-01-08 ENCOUNTER — Encounter: Payer: Self-pay | Admitting: Podiatry

## 2022-01-08 DIAGNOSIS — M79672 Pain in left foot: Secondary | ICD-10-CM | POA: Diagnosis not present

## 2022-01-08 DIAGNOSIS — M79674 Pain in right toe(s): Secondary | ICD-10-CM

## 2022-01-08 DIAGNOSIS — B351 Tinea unguium: Secondary | ICD-10-CM

## 2022-01-08 DIAGNOSIS — M79675 Pain in left toe(s): Secondary | ICD-10-CM | POA: Diagnosis not present

## 2022-01-08 DIAGNOSIS — Q828 Other specified congenital malformations of skin: Secondary | ICD-10-CM | POA: Diagnosis not present

## 2022-01-08 DIAGNOSIS — M79671 Pain in right foot: Secondary | ICD-10-CM | POA: Diagnosis not present

## 2022-01-08 DIAGNOSIS — L84 Corns and callosities: Secondary | ICD-10-CM | POA: Diagnosis not present

## 2022-01-13 NOTE — Progress Notes (Signed)
?  Subjective:  ?Patient ID: Ashlee Duarte, female    DOB: [birth year],  MRN: [id number] ? ?Ashlee Duarte presents to clinic today for painful porokeratotic lesion(s) right heel, painful corns/calluses of both feet and painful mycotic toenails that limit ambulation. Painful toenails interfere with ambulation. Aggravating factors include wearing enclosed shoe gear. Pain is relieved with periodic professional debridement. Painful porokeratotic lesions, corns/calluses are aggravated when weightbearing with and without shoegear. Pain is relieved with periodic professional debridement. ? ?New problem(s): None.  ? ?PCP is Burton Apley, MD , and last visit was September, 2022, per patient recall. ? ?Allergies  ?Allergen Reactions  ? Naproxen Sodium Swelling  ? ?Review of Systems: Negative except as noted in the HPI. ? ?Objective:  ?Constitutional Ashlee Duarte is a pleasant 88 y.o. African American female, WD, WN in NAD. AAO x 3.   ?Vascular CFT immediate b/l LE. Palpable DP/PT pulses b/l LE. Digital hair absent b/l. Skin temperature gradient WNL b/l. No pain with calf compression b/l. No edema noted b/l. No cyanosis or clubbing noted b/l LE.  ?Neurologic Normal speech. Oriented to person, place, and time. Protective sensation intact 5/5 intact bilaterally with 10g monofilament b/l. Vibratory sensation intact b/l.  ?Dermatologic Pedal integument with normal turgor, texture and tone b/l LE. No open wounds b/l. No interdigital macerations b/l. Toenails 1-5 b/l elongated, thickened, discolored with subungual debris. +Tenderness with dorsal palpation of nailplates. Hyperkeratotic lesion(s) noted bilateral 3rd toes and submet head 5 b/l. Porokeratotic lesion(s) plantar heel pad of right foot. No erythema, no edema, no drainage, no fluctuance.  ?Orthopedic: Normal muscle strength 5/5 to all lower extremity muscle groups bilaterally. HAV with bunion deformity noted b/l LE. Hammertoe deformity  noted 2-5 b/l.Marland Kitchen No pain, crepitus or joint limitation noted with ROM b/l LE.  Patient ambulates independently without assistive aids.  ? ?Radiographs: None ?Assessment/Plan: ?1. Pain due to onychomycosis of toenails of both feet   ?2. Corns and callosities   ?3. Porokeratosis   ?4. Pain in both feet   ?  ?-Patient was evaluated and treated. All patient's and/or POA's questions/concerns answered on today's visit. ?-Toenails 1-5 b/l were debrided in length and girth with sterile nail nippers and dremel without iatrogenic bleeding.  ?-Corn(s) L 3rd toe, L 5th toe, and R 3rd toe and callus(es) submet head 5 left foot and submet head 5 right foot were pared utilizing sterile scalpel blade without incident. Total number debrided =5. ?-Painful porokeratotic lesion(s) plantar heel pad of right foot pared and enucleated with sterile scalpel blade without incident. Total number of lesions debrided=1. ?-Patient/POA to call should there be question/concern in the interim.  ? ?Return in about 9 weeks (around 03/12/2022). ? ?Freddie Breech, DPM  ?

## 2022-03-20 ENCOUNTER — Ambulatory Visit (INDEPENDENT_AMBULATORY_CARE_PROVIDER_SITE_OTHER): Payer: Medicare PPO | Admitting: Podiatry

## 2022-03-20 ENCOUNTER — Encounter: Payer: Self-pay | Admitting: Podiatry

## 2022-03-20 DIAGNOSIS — M79675 Pain in left toe(s): Secondary | ICD-10-CM

## 2022-03-20 DIAGNOSIS — B351 Tinea unguium: Secondary | ICD-10-CM

## 2022-03-20 DIAGNOSIS — M79671 Pain in right foot: Secondary | ICD-10-CM | POA: Diagnosis not present

## 2022-03-20 DIAGNOSIS — L84 Corns and callosities: Secondary | ICD-10-CM | POA: Diagnosis not present

## 2022-03-20 DIAGNOSIS — M79672 Pain in left foot: Secondary | ICD-10-CM | POA: Diagnosis not present

## 2022-03-20 DIAGNOSIS — Q828 Other specified congenital malformations of skin: Secondary | ICD-10-CM | POA: Diagnosis not present

## 2022-03-20 DIAGNOSIS — M79674 Pain in right toe(s): Secondary | ICD-10-CM | POA: Diagnosis not present

## 2022-03-25 NOTE — Progress Notes (Signed)
  Subjective:  Patient ID: Ashlee Duarte, female    DOB: 1928/05/19,  MRN: [id number]  Ashlee Duarte presents to clinic today for corn(s)  b/l lower extremities, porokeratotic lesion(s) right lower extremity and painful mycotic nails. Painful toenails interfere with ambulation. Aggravating factors include wearing enclosed shoe gear. Pain is relieved with periodic professional debridement. Painful corns and porokeratotic lesion(s) aggravated when weightbearing with and without shoegear. Pain is relieved with periodic professional debridement.  New problem(s): None.   PCP is Burton Apley, MD , and last visit was Feb 25, 2022.  Allergies  Allergen Reactions   Naproxen Sodium Swelling    Review of Systems: Negative except as noted in the HPI.  Objective: No changes noted in today's physical examination. Constitutional Ashlee Duarte is a pleasant 88 y.o. African American female, WD, WN in NAD. AAO x 3.   Vascular CFT immediate b/l LE. Palpable DP/PT pulses b/l LE. Digital hair absent b/l. Skin temperature gradient WNL b/l. No pain with calf compression b/l. No edema noted b/l. No cyanosis or clubbing noted b/l LE.  Neurologic Normal speech. Oriented to person, place, and time. Protective sensation intact 5/5 intact bilaterally with 10g monofilament b/l. Vibratory sensation intact b/l.  Dermatologic Pedal integument with normal turgor, texture and tone b/l LE. No open wounds b/l. No interdigital macerations b/l. Toenails 1-5 b/l elongated, thickened, discolored with subungual debris. +Tenderness with dorsal palpation of nailplates. Hyperkeratotic lesion(s) noted  distal tip bilateral 3rd toes and submet head 5 left and dorsal PIPJ bilateral 5th toes. Porokeratotic lesion(s) plantarlateral arch of right foot. No erythema, no edema, no drainage, no fluctuance.  Orthopedic: Normal muscle strength 5/5 to all lower extremity muscle groups bilaterally. HAV with bunion  deformity noted b/l LE. Hammertoe deformity noted 2-5 b/l.Marland Kitchen No pain, crepitus or joint limitation noted with ROM b/l LE.  Patient ambulates independently without assistive aids.   Radiographs: None  Assessment/Plan: 1. Pain due to onychomycosis of toenails of both feet   2. Corns and callosities   3. Porokeratosis   4. Pain in both feet     -Examined patient. -Medicare ABN signed. Patient consents for services of paring of corn(s)/callus(es)/porokeratos(es) today. Copy in patient chart. -Mycotic toenails 1-5 bilaterally were debrided in length and girth with sterile nail nippers and dremel without incident. -Corn(s) bilateral 3rd toes and bilateral 5th toes and callus(es) submet head 5 left foot were pared utilizing sterile scalpel blade without incident. Total number debrided =5. -Porokeratotic lesion(s) plantarlateral arch left foot pared and enucleated with sterile scalpel blade without incident. Total number of lesions debrided=1. -Patient/POA to call should there be question/concern in the interim.   Return in about 3 months (around 06/20/2022).  Freddie Breech, DPM

## 2022-05-10 ENCOUNTER — Other Ambulatory Visit: Payer: Self-pay | Admitting: Internal Medicine

## 2022-05-10 DIAGNOSIS — Z1231 Encounter for screening mammogram for malignant neoplasm of breast: Secondary | ICD-10-CM

## 2022-06-12 ENCOUNTER — Ambulatory Visit: Payer: Medicare PPO

## 2022-06-25 ENCOUNTER — Encounter: Payer: Self-pay | Admitting: Podiatry

## 2022-06-25 ENCOUNTER — Ambulatory Visit
Admission: RE | Admit: 2022-06-25 | Discharge: 2022-06-25 | Disposition: A | Discharge status: Home / Self Care | Payer: Medicare PPO | Source: Ambulatory Visit | Attending: Internal Medicine | Admitting: Internal Medicine

## 2022-06-25 ENCOUNTER — Ambulatory Visit (INDEPENDENT_AMBULATORY_CARE_PROVIDER_SITE_OTHER): Payer: Medicare PPO | Admitting: Podiatry

## 2022-06-25 DIAGNOSIS — M79672 Pain in left foot: Secondary | ICD-10-CM | POA: Diagnosis not present

## 2022-06-25 DIAGNOSIS — M79674 Pain in right toe(s): Secondary | ICD-10-CM | POA: Diagnosis not present

## 2022-06-25 DIAGNOSIS — M79671 Pain in right foot: Secondary | ICD-10-CM | POA: Diagnosis not present

## 2022-06-25 DIAGNOSIS — Q828 Other specified congenital malformations of skin: Secondary | ICD-10-CM

## 2022-06-25 DIAGNOSIS — M79675 Pain in left toe(s): Secondary | ICD-10-CM | POA: Diagnosis not present

## 2022-06-25 DIAGNOSIS — Z1231 Encounter for screening mammogram for malignant neoplasm of breast: Secondary | ICD-10-CM

## 2022-06-25 DIAGNOSIS — L84 Corns and callosities: Secondary | ICD-10-CM | POA: Diagnosis not present

## 2022-06-25 DIAGNOSIS — B351 Tinea unguium: Secondary | ICD-10-CM | POA: Diagnosis not present

## 2022-06-30 NOTE — Progress Notes (Signed)
  Subjective:  Patient ID: Nathaniel Man, female    DOB: [birth year],  MRN: [id number]  Linlee Cromie presents to clinic today for corn(s) b/l lower extremities, callus(es) left lower extremity, porokeratotic lesion(s) right lower extremity,and painful mycotic nails. Painful toenails interfere with ambulation. Aggravating factors include wearing enclosed shoe gear. Pain is relieved with periodic professional debridement. Painful corns, callus(es) and porokeratotic lesion(s) are aggravated when weightbearing with and without shoegear. Pain is relieved with periodic professional debridement.  PCP is Lorene Dy, MD , and last visit was May 29, 2022.  Allergies  Allergen Reactions   Naproxen Sodium Swelling    Review of Systems: Negative except as noted in the HPI.  Objective: No changes noted in today's physical examination. Zoriana Oats is a pleasant 88 y.o. female in NAD. AAO x 3.  Vascular Examination: CFT immediate b/l LE. Palpable DP/PT pulses b/l LE. Digital hair absent b/l. Skin temperature gradient WNL b/l. No pain with calf compression b/l. No edema noted b/l. No cyanosis or clubbing noted b/l LE.Marland Kitchen  Dermatological Examination: Pedal skin with normal turgor, texture and tone b/l. No open wounds. No interdigital macerations b/l. Toenails 1-5 b/l thickened, discolored, dystrophic with subungual debris. There is pain on palpation to dorsal aspect of nailplates. Hyperkeratotic lesion(s) distal tip of left 3rd toe, distal tip of right 3rd toe, dorsal PIPJ of bilateral 5th toes, and submet head 5 left foot.  No erythema, no edema, no drainage, no fluctuance. Porokeratotic lesion(s) plantarlateral aspect of midfoot right foot. No erythema, no edema, no drainage, no fluctuance..  Neurological Examination: Protective sensation intact with 10 gram monofilament b/l LE. Vibratory sensation intact b/l LE.   Musculoskeletal Examination: Muscle strength 5/5 to  all LE muscle groups b/l. HAV with bunion bilaterally and hammertoes 2-5 b/l. Patient ambulates independent of any assistive aids.  Assessment/Plan: 1. Pain due to onychomycosis of toenails of both feet   2. Corns and callosities   3. Porokeratosis   4. Pain in both feet     -Patient was evaluated and treated. All patient's and/or POA's questions/concerns answered on today's visit. -Medicare ABN signed for services of paring of corn(s)/callus(es)/porokeratos(es). Copy in patient chart. -Toenails 1-5 b/l were debrided in length and girth with sterile nail nippers and dremel without iatrogenic bleeding.  -Corn(s) bilateral 3rd toes and bilateral 5th toes pared utilizing sterile scalpel blade without complication or incident. Total number debrided=4. -Callus(es) submet head 5 left foot pared utilizing sterile scalpel blade without complication or incident. Total number debrided =1. -Porokeratotic lesion(s) plantarlateral aspect of midfoot right foot pared and enucleated with sterile currette without incident. Total number of lesions debrided=1. -Patient/POA to call should there be question/concern in the interim.   Return in about 3 months (around 09/24/2022).  Marzetta Board, DPM

## 2022-07-22 ENCOUNTER — Other Ambulatory Visit: Payer: Self-pay | Admitting: Internal Medicine

## 2022-07-22 ENCOUNTER — Ambulatory Visit
Admission: RE | Admit: 2022-07-22 | Discharge: 2022-07-22 | Disposition: A | Discharge status: Home / Self Care | Payer: Medicare PPO | Source: Ambulatory Visit | Attending: Internal Medicine | Admitting: Internal Medicine

## 2022-07-22 DIAGNOSIS — R059 Cough, unspecified: Secondary | ICD-10-CM

## 2022-10-15 ENCOUNTER — Ambulatory Visit: Payer: Medicare PPO | Admitting: Podiatry

## 2022-10-24 ENCOUNTER — Encounter: Payer: Self-pay | Admitting: Podiatry

## 2022-10-24 ENCOUNTER — Ambulatory Visit: Payer: Medicare PPO | Admitting: Podiatry

## 2022-10-24 VITALS — BP 134/74

## 2022-10-24 DIAGNOSIS — M79674 Pain in right toe(s): Secondary | ICD-10-CM | POA: Diagnosis not present

## 2022-10-24 DIAGNOSIS — B351 Tinea unguium: Secondary | ICD-10-CM | POA: Diagnosis not present

## 2022-10-24 DIAGNOSIS — M79675 Pain in left toe(s): Secondary | ICD-10-CM | POA: Diagnosis not present

## 2022-10-24 NOTE — Progress Notes (Unsigned)
  Subjective:  Patient ID: Ashlee Duarte, female    DOB: 18-May-1928,  MRN: [id number]  Ashlee Duarte presents to clinic today for corn(s) b/l lower extremities, callus(es) left lower extremity, porokeratotic lesion(s) right lower extremity,and painful mycotic nails. Painful toenails interfere with ambulation. Aggravating factors include wearing enclosed shoe gear. Pain is relieved with periodic professional debridement. Painful corns, callus(es) and porokeratotic lesion(s) are aggravated when weightbearing with and without shoegear. Pain is relieved with periodic professional debridement.  PCP is Ashlee Dy, MD , and last visit was May 29, 2022.  Allergies  Allergen Reactions   Naproxen Sodium Swelling    Review of Systems: Negative except as noted in the HPI.  Objective: No changes noted in today's physical examination. Ashlee Duarte is a pleasant 89 y.o. female in NAD. AAO x 3.  Vascular Examination: CFT immediate b/l LE. Palpable DP/PT pulses b/l LE. Digital hair absent b/l. Skin temperature gradient WNL b/l. No pain with calf compression b/l. No edema noted b/l. No cyanosis or clubbing noted b/l LE.Marland Kitchen  Dermatological Examination: Pedal skin with normal turgor, texture and tone b/l. No open wounds. No interdigital macerations b/l. Toenails 1-5 b/l thickened, discolored, dystrophic with subungual debris. There is pain on palpation to dorsal aspect of nailplates. Hyperkeratotic lesion(s) distal tip of left 3rd toe, distal tip of right 3rd toe, dorsal PIPJ of bilateral 5th toes, and submet head 5 left foot.  No erythema, no edema, no drainage, no fluctuance. Porokeratotic lesion(s) plantarlateral aspect of midfoot right foot. No erythema, no edema, no drainage, no fluctuance..  Neurological Examination: Protective sensation intact with 10 gram monofilament b/l LE. Vibratory sensation intact b/l LE.   Musculoskeletal Examination: Muscle strength 5/5 to  all LE muscle groups b/l. HAV with bunion bilaterally and hammertoes 2-5 b/l. Patient ambulates independent of any assistive aids.  Assessment/Plan: No diagnosis found.   -Patient was evaluated and treated. All patient's and/or POA's questions/concerns answered on today's visit. -Medicare ABN signed for services of paring of corn(s)/callus(es)/porokeratos(es). Copy in patient chart. -Toenails 1-5 b/l were debrided in length and girth with sterile nail nippers and dremel without iatrogenic bleeding.  -Corn(s) bilateral 3rd toes and bilateral 5th toes pared utilizing sterile scalpel blade without complication or incident. Total number debrided=4. -Callus(es) submet head 5 left foot pared utilizing sterile scalpel blade without complication or incident. Total number debrided =1. -Porokeratotic lesion(s) plantarlateral aspect of midfoot right foot pared and enucleated with sterile currette without incident. Total number of lesions debrided=1. -Patient/POA to call should there be question/concern in the interim.   Return in about 3 months (around 01/23/2023).  Felipa Furnace, DPM

## 2022-12-24 ENCOUNTER — Ambulatory Visit: Payer: Medicare PPO | Admitting: Podiatry

## 2023-01-13 ENCOUNTER — Ambulatory Visit: Payer: Medicare PPO | Admitting: Podiatry

## 2023-02-03 ENCOUNTER — Encounter: Payer: Self-pay | Admitting: Podiatry

## 2023-02-03 ENCOUNTER — Ambulatory Visit (INDEPENDENT_AMBULATORY_CARE_PROVIDER_SITE_OTHER): Payer: Medicare PPO | Admitting: Podiatry

## 2023-02-03 DIAGNOSIS — Q828 Other specified congenital malformations of skin: Secondary | ICD-10-CM

## 2023-02-03 DIAGNOSIS — M79675 Pain in left toe(s): Secondary | ICD-10-CM

## 2023-02-03 DIAGNOSIS — B351 Tinea unguium: Secondary | ICD-10-CM

## 2023-02-03 DIAGNOSIS — M79674 Pain in right toe(s): Secondary | ICD-10-CM

## 2023-02-03 NOTE — Progress Notes (Signed)
This patient presents to the office with chief complaint of long thick painful nails.  Patient says the nails are painful walking and wearing shoes.  This patient is unable to self treat.  This patient is unable to trim her nails since she is unable to reach her nails. She has painful fifth toe corn left foot and callus under the outer ball of her left foot. She presents to the office for preventative foot care services.  General Appearance  Alert, conversant and in no acute stress.  Vascular  Dorsalis pedis and posterior tibial  pulses are  weakly palpable  bilaterally.  Capillary return is within normal limits  bilaterally. Temperature is within normal limits  bilaterally.  Neurologic  Senn-Weinstein monofilament wire test within normal limits  bilaterally. Muscle power within normal limits bilaterally.  Nails Thick disfigured discolored nails with subungual debris  from hallux to fifth toes bilaterally. No evidence of bacterial infection or drainage bilaterally.  Orthopedic  No limitations of motion  feet .  No crepitus or effusions noted.  HAV  B/L.  Hammer toes  B/L.    Skin  normotropic skin with no porokeratosis noted bilaterally.  No signs of infections or ulcers noted.   Heloma durum fifth toe left foot.  Onychomycosis  Nails  B/L.  Pain in right toes  Pain in left toes  Callus  left foot.  Debridement of nails both feet followed trimming the nails with dremel tool.    RTC 3 months.   Helane Gunther DPM

## 2023-04-08 ENCOUNTER — Other Ambulatory Visit (HOSPITAL_COMMUNITY): Payer: Self-pay | Admitting: Internal Medicine

## 2023-04-08 DIAGNOSIS — G459 Transient cerebral ischemic attack, unspecified: Secondary | ICD-10-CM

## 2023-04-16 ENCOUNTER — Ambulatory Visit: Payer: Medicare PPO | Admitting: Podiatry

## 2023-04-21 ENCOUNTER — Ambulatory Visit (HOSPITAL_COMMUNITY)
Admission: RE | Admit: 2023-04-21 | Discharge: 2023-04-21 | Disposition: A | Discharge status: Home / Self Care | Payer: Medicare PPO | Source: Ambulatory Visit | Attending: Internal Medicine | Admitting: Internal Medicine

## 2023-04-21 DIAGNOSIS — G459 Transient cerebral ischemic attack, unspecified: Secondary | ICD-10-CM

## 2023-04-23 ENCOUNTER — Encounter: Payer: Self-pay | Admitting: Podiatry

## 2023-04-23 ENCOUNTER — Ambulatory Visit (INDEPENDENT_AMBULATORY_CARE_PROVIDER_SITE_OTHER): Payer: Medicare PPO | Admitting: Podiatry

## 2023-04-23 DIAGNOSIS — B351 Tinea unguium: Secondary | ICD-10-CM | POA: Diagnosis not present

## 2023-04-23 DIAGNOSIS — M79675 Pain in left toe(s): Secondary | ICD-10-CM | POA: Diagnosis not present

## 2023-04-23 DIAGNOSIS — M79674 Pain in right toe(s): Secondary | ICD-10-CM | POA: Diagnosis not present

## 2023-04-23 DIAGNOSIS — L84 Corns and callosities: Secondary | ICD-10-CM

## 2023-04-23 NOTE — Progress Notes (Signed)
This patient presents to the office with chief complaint of long thick painful nails.  Patient says the nails are painful walking and wearing shoes.  This patient is unable to self treat.  This patient is unable to trim her nails since she is unable to reach her nails. She has painful fifth toe corn left foot . She presents to the office for preventative foot care services.  General Appearance  Alert, conversant and in no acute stress.  Vascular  Dorsalis pedis and posterior tibial  pulses are  weakly palpable  bilaterally.  Capillary return is within normal limits  bilaterally. Temperature is within normal limits  bilaterally.  Neurologic  Senn-Weinstein monofilament wire test within normal limits  bilaterally. Muscle power within normal limits bilaterally.  Nails Thick disfigured discolored nails with subungual debris  from hallux to fifth toes bilaterally. No evidence of bacterial infection or drainage bilaterally.  Orthopedic  No limitations of motion  feet .  No crepitus or effusions noted.  HAV  B/L.  Hammer toes  B/L.    Skin  normotropic skin with no porokeratosis noted bilaterally.  No signs of infections or ulcers noted.   Heloma durum fifth toe left foot.  Onychomycosis  Nails  B/L.  Pain in right toes  Pain in left toes  Callus  left foot.  Debridement of nails both feet followed trimming the nails with dremel tool.   Debride callus fifth toe left with # 15 blade.  RTC 3 months.   Helane Gunther DPM

## 2023-05-19 ENCOUNTER — Other Ambulatory Visit: Payer: Self-pay | Admitting: Internal Medicine

## 2023-05-19 DIAGNOSIS — Z1231 Encounter for screening mammogram for malignant neoplasm of breast: Secondary | ICD-10-CM

## 2023-06-24 ENCOUNTER — Ambulatory Visit: Payer: Medicare PPO

## 2023-06-25 ENCOUNTER — Ambulatory Visit: Payer: Medicare PPO | Admitting: Podiatry

## 2023-06-30 ENCOUNTER — Ambulatory Visit
Admission: RE | Admit: 2023-06-30 | Discharge: 2023-06-30 | Disposition: A | Discharge status: Home / Self Care | Payer: Medicare PPO | Source: Ambulatory Visit | Attending: Internal Medicine | Admitting: Internal Medicine

## 2023-06-30 DIAGNOSIS — Z1231 Encounter for screening mammogram for malignant neoplasm of breast: Secondary | ICD-10-CM

## 2023-07-04 ENCOUNTER — Ambulatory Visit: Payer: Medicare PPO | Admitting: Podiatry

## 2023-07-16 ENCOUNTER — Ambulatory Visit: Payer: Medicare PPO | Admitting: Podiatry

## 2023-07-21 ENCOUNTER — Ambulatory Visit (INDEPENDENT_AMBULATORY_CARE_PROVIDER_SITE_OTHER): Payer: Medicare PPO | Admitting: Podiatry

## 2023-07-21 ENCOUNTER — Encounter: Payer: Self-pay | Admitting: Podiatry

## 2023-07-21 DIAGNOSIS — M79671 Pain in right foot: Secondary | ICD-10-CM

## 2023-07-21 DIAGNOSIS — M79675 Pain in left toe(s): Secondary | ICD-10-CM

## 2023-07-21 DIAGNOSIS — B351 Tinea unguium: Secondary | ICD-10-CM | POA: Diagnosis not present

## 2023-07-21 DIAGNOSIS — M79674 Pain in right toe(s): Secondary | ICD-10-CM | POA: Diagnosis not present

## 2023-07-21 NOTE — Progress Notes (Signed)
This patient presents to the office with chief complaint of long thick painful nails.  Patient says the nails are painful walking and wearing shoes.  This patient is unable to self treat.  This patient is unable to trim her nails since she is unable to reach her nails. . She presents to the office for preventative foot care services.  General Appearance  Alert, conversant and in no acute stress.  Vascular  Dorsalis pedis and posterior tibial  pulses are  weakly palpable  bilaterally.  Capillary return is within normal limits  bilaterally. Temperature is within normal limits  bilaterally.  Neurologic  Senn-Weinstein monofilament wire test within normal limits  bilaterally. Muscle power within normal limits bilaterally.  Nails Thick disfigured discolored nails with subungual debris  from hallux to fifth toes bilaterally. No evidence of bacterial infection or drainage bilaterally.  Orthopedic  No limitations of motion  feet .  No crepitus or effusions noted.  HAV  B/L.  Hammer toes  B/L.    Skin  normotropic skin with no porokeratosis noted bilaterally.  No signs of infections or ulcers noted.   Heloma durum fifth toe left foot.asymptomatic.  Onychomycosis  Nails  B/L.  Pain in right toes  Pain in left toes  Callus  left foot.  Debridement of nails both feet followed trimming the nails with dremel tool.  Dispense crest pads and toe cap second  B/L.   RTC 3 months.   Helane Gunther DPM

## 2023-10-22 ENCOUNTER — Ambulatory Visit: Payer: Medicare PPO | Admitting: Podiatry

## 2023-11-28 ENCOUNTER — Ambulatory Visit: Payer: Medicare PPO | Admitting: Podiatry

## 2023-12-11 ENCOUNTER — Other Ambulatory Visit: Payer: Self-pay | Admitting: Internal Medicine

## 2023-12-11 ENCOUNTER — Ambulatory Visit
Admission: RE | Admit: 2023-12-11 | Discharge: 2023-12-11 | Disposition: A | Discharge status: Home / Self Care | Source: Ambulatory Visit | Attending: Internal Medicine | Admitting: Internal Medicine

## 2023-12-11 DIAGNOSIS — M898X9 Other specified disorders of bone, unspecified site: Secondary | ICD-10-CM

## 2024-01-08 ENCOUNTER — Ambulatory Visit (INDEPENDENT_AMBULATORY_CARE_PROVIDER_SITE_OTHER): Admitting: Podiatry

## 2024-01-08 ENCOUNTER — Encounter: Payer: Self-pay | Admitting: Podiatry

## 2024-01-08 DIAGNOSIS — L84 Corns and callosities: Secondary | ICD-10-CM | POA: Diagnosis not present

## 2024-01-08 DIAGNOSIS — M79672 Pain in left foot: Secondary | ICD-10-CM

## 2024-01-08 DIAGNOSIS — M79674 Pain in right toe(s): Secondary | ICD-10-CM | POA: Diagnosis not present

## 2024-01-08 DIAGNOSIS — B351 Tinea unguium: Secondary | ICD-10-CM

## 2024-01-08 DIAGNOSIS — M79675 Pain in left toe(s): Secondary | ICD-10-CM

## 2024-01-08 DIAGNOSIS — M79671 Pain in right foot: Secondary | ICD-10-CM

## 2024-01-08 NOTE — Progress Notes (Signed)
 This patient presents to the office with chief complaint of long thick painful nails.  Patient says the nails are painful walking and wearing shoes.  This patient is unable to self treat.  This patient is unable to trim her nails since she is unable to reach her nails. . She presents to the office for preventative foot care services.  General Appearance  Alert, conversant and in no acute stress.  Vascular  Dorsalis pedis and posterior tibial  pulses are  weakly palpable  bilaterally.  Capillary return is within normal limits  bilaterally. Temperature is within normal limits  bilaterally.  Neurologic  Senn-Weinstein monofilament wire test within normal limits  bilaterally. Muscle power within normal limits bilaterally.  Nails Thick disfigured discolored nails with subungual debris  from hallux to fifth toes bilaterally. No evidence of bacterial infection or drainage bilaterally.  Orthopedic  No limitations of motion  feet .  No crepitus or effusions noted.  HAV  B/L.  Hammer toes  B/L.    Skin  normotropic skin with no porokeratosis noted bilaterally.  No signs of infections or ulcers noted.   Heloma durum fifth toe left foot.symptomatic.  Onychomycosis  Nails  B/L.  Pain in right toes  Pain in left toes  Callus  left foot.  Debridement of nails both feet followed trimming the nails with dremel tool.  Debride callus  B/L. RTC 3 months.   Helane Gunther DPM

## 2024-04-12 ENCOUNTER — Ambulatory Visit: Admitting: Podiatry

## 2024-04-14 ENCOUNTER — Ambulatory Visit (INDEPENDENT_AMBULATORY_CARE_PROVIDER_SITE_OTHER): Admitting: Podiatry

## 2024-04-14 ENCOUNTER — Encounter: Payer: Self-pay | Admitting: Podiatry

## 2024-04-14 DIAGNOSIS — M79674 Pain in right toe(s): Secondary | ICD-10-CM | POA: Diagnosis not present

## 2024-04-14 DIAGNOSIS — M79671 Pain in right foot: Secondary | ICD-10-CM

## 2024-04-14 DIAGNOSIS — B351 Tinea unguium: Secondary | ICD-10-CM

## 2024-04-14 DIAGNOSIS — M79672 Pain in left foot: Secondary | ICD-10-CM | POA: Diagnosis not present

## 2024-04-14 DIAGNOSIS — L84 Corns and callosities: Secondary | ICD-10-CM | POA: Diagnosis not present

## 2024-04-14 DIAGNOSIS — M79675 Pain in left toe(s): Secondary | ICD-10-CM | POA: Diagnosis not present

## 2024-04-14 NOTE — Progress Notes (Signed)
 This patient presents to the office with chief complaint of long thick painful nails.  Patient says the nails are painful walking and wearing shoes.  This patient is unable to self treat.  This patient is unable to trim her nails since she is unable to reach her nails. . She presents to the office for preventative foot care services.  General Appearance  Alert, conversant and in no acute stress.  Vascular  Dorsalis pedis and posterior tibial  pulses are  weakly palpable  bilaterally.  Capillary return is within normal limits  bilaterally. Temperature is within normal limits  bilaterally.  Neurologic  Senn-Weinstein monofilament wire test within normal limits  bilaterally. Muscle power within normal limits bilaterally.  Nails Thick disfigured discolored nails with subungual debris  from hallux to fifth toes bilaterally. No evidence of bacterial infection or drainage bilaterally.  Orthopedic  No limitations of motion  feet .  No crepitus or effusions noted.  HAV  B/L.  Hammer toes  B/L.    Skin  normotropic skin with no porokeratosis noted bilaterally.  No signs of infections or ulcers noted.   Heloma durum fifth toe left foot.symptomatic.  Onychomycosis  Nails  B/L.  Pain in right toes  Pain in left toes  Callus  left foot.  Debridement of nails both feet followed trimming the nails with dremel tool.  Debride callus  B/L. RTC 3 months.   Helane Gunther DPM

## 2024-06-25 ENCOUNTER — Other Ambulatory Visit: Payer: Self-pay | Admitting: Internal Medicine

## 2024-06-25 DIAGNOSIS — Z1231 Encounter for screening mammogram for malignant neoplasm of breast: Secondary | ICD-10-CM

## 2024-07-07 ENCOUNTER — Ambulatory Visit: Admitting: Podiatry

## 2024-07-12 ENCOUNTER — Ambulatory Visit (INDEPENDENT_AMBULATORY_CARE_PROVIDER_SITE_OTHER): Admitting: Podiatry

## 2024-07-12 ENCOUNTER — Encounter: Payer: Self-pay | Admitting: Podiatry

## 2024-07-12 DIAGNOSIS — M79675 Pain in left toe(s): Secondary | ICD-10-CM

## 2024-07-12 DIAGNOSIS — M79672 Pain in left foot: Secondary | ICD-10-CM | POA: Diagnosis not present

## 2024-07-12 DIAGNOSIS — B351 Tinea unguium: Secondary | ICD-10-CM | POA: Diagnosis not present

## 2024-07-12 DIAGNOSIS — M79674 Pain in right toe(s): Secondary | ICD-10-CM

## 2024-07-12 DIAGNOSIS — M79671 Pain in right foot: Secondary | ICD-10-CM

## 2024-07-12 NOTE — Progress Notes (Signed)
 This patient presents to the office with chief complaint of long thick painful nails.  Patient says the nails are painful walking and wearing shoes.  This patient is unable to self treat.  This patient is unable to trim her nails since she is unable to reach her nails. .She has painful corn second toe left foot. She presents to the office for preventative foot care services.  General Appearance  Alert, conversant and in no acute stress.  Vascular  Dorsalis pedis and posterior tibial  pulses are  weakly palpable  bilaterally.  Capillary return is within normal limits  bilaterally. Temperature is within normal limits  bilaterally.  Neurologic  Senn-Weinstein monofilament wire test within normal limits  bilaterally. Muscle power within normal limits bilaterally.  Nails Thick disfigured discolored nails with subungual debris  from hallux to fifth toes bilaterally. No evidence of bacterial infection or drainage bilaterally.  Orthopedic  No limitations of motion  feet .  No crepitus or effusions noted.  HAV  B/L.  Hammer toes  B/L.    Skin  normotropic skin with no porokeratosis noted bilaterally.  No signs of infections or ulcers noted.   Heloma durum fifth toe left foot.symptomatic.  Onychomycosis  Nails  B/L.  Pain in right toes  Pain in left toes  Clavi second toe left foot.  left foot.  Debridement of nails both feet followed trimming the nails with dremel tool.  Debride clavi with # 15 blade second toe left foot.  Padding dispensed. RTC 3 months.   Cordella Bold DPM

## 2024-07-16 ENCOUNTER — Ambulatory Visit
Admission: RE | Admit: 2024-07-16 | Discharge: 2024-07-16 | Disposition: A | Discharge status: Home / Self Care | Source: Ambulatory Visit | Attending: Internal Medicine | Admitting: Internal Medicine

## 2024-07-16 DIAGNOSIS — Z1231 Encounter for screening mammogram for malignant neoplasm of breast: Secondary | ICD-10-CM

## 2024-09-24 ENCOUNTER — Encounter: Payer: Self-pay | Admitting: Podiatry

## 2024-09-24 ENCOUNTER — Ambulatory Visit: Admitting: Podiatry

## 2024-09-24 DIAGNOSIS — M79674 Pain in right toe(s): Secondary | ICD-10-CM | POA: Diagnosis not present

## 2024-09-24 DIAGNOSIS — M79671 Pain in right foot: Secondary | ICD-10-CM

## 2024-09-24 DIAGNOSIS — L84 Corns and callosities: Secondary | ICD-10-CM | POA: Diagnosis not present

## 2024-09-24 DIAGNOSIS — M79675 Pain in left toe(s): Secondary | ICD-10-CM

## 2024-09-24 DIAGNOSIS — B351 Tinea unguium: Secondary | ICD-10-CM | POA: Diagnosis not present

## 2024-09-24 DIAGNOSIS — M79672 Pain in left foot: Secondary | ICD-10-CM

## 2024-09-24 NOTE — Progress Notes (Signed)
 This patient presents to the office with chief complaint of long thick painful nails.  Patient says the nails are painful walking and wearing shoes.  This patient is unable to self treat.  This patient is unable to trim her nails since she is unable to reach her nails. .She has painful corn second toe left foot. She presents to the office for preventative foot care services.  General Appearance  Alert, conversant and in no acute stress.  Vascular  Dorsalis pedis and posterior tibial  pulses are  weakly palpable  bilaterally.  Capillary return is within normal limits  bilaterally. Temperature is within normal limits  bilaterally.  Neurologic  Senn-Weinstein monofilament wire test within normal limits  bilaterally. Muscle power within normal limits bilaterally.  Nails Thick disfigured discolored nails with subungual debris  from hallux to fifth toes bilaterally. No evidence of bacterial infection or drainage bilaterally.  Orthopedic  No limitations of motion  feet .  No crepitus or effusions noted.  HAV  B/L.  Hammer toes  B/L.    Skin  normotropic skin with no porokeratosis noted bilaterally.  No signs of infections or ulcers noted.   Heloma durum fifth toe left foot.symptomatic. Callus sub 5th left foot.  Onychomycosis  Nails  B/L.  Pain in right toes  Pain in left toes  Clavi second toe left foot.  left foot. HD 5th right.  Callus sub 5th met left foot.  Debridement of nails both feet followed trimming the nails with dremel tool.  Debride callus and HD  with # 15 blade.and dremel tool. RTC 3 months.   Cordella Bold DPM
